# Patient Record
Sex: Male | Born: 1975
Health system: Southern US, Community
[De-identification: ages and names within clinical notes are randomized; demographics above are authoritative.]

## PROBLEM LIST (undated history)

## (undated) DIAGNOSIS — G4730 Sleep apnea, unspecified: Secondary | ICD-10-CM

## (undated) DIAGNOSIS — Z9889 Other specified postprocedural states: Secondary | ICD-10-CM

## (undated) DIAGNOSIS — I10 Essential (primary) hypertension: Secondary | ICD-10-CM

## (undated) DIAGNOSIS — S79912A Unspecified injury of left hip, initial encounter: Secondary | ICD-10-CM

## (undated) DIAGNOSIS — E119 Type 2 diabetes mellitus without complications: Secondary | ICD-10-CM

## (undated) DIAGNOSIS — F329 Major depressive disorder, single episode, unspecified: Secondary | ICD-10-CM

## (undated) DIAGNOSIS — G43109 Migraine with aura, not intractable, without status migrainosus: Secondary | ICD-10-CM

## (undated) HISTORY — PX: WISDOM TOOTH EXTRACTION: SHX21

## (undated) HISTORY — PX: CORONARY ARTERY BYPASS GRAFT: SHX141

## (undated) HISTORY — DX: Migraine with aura, not intractable, without status migrainosus: G43.109

## (undated) HISTORY — DX: Sleep apnea, unspecified: G47.30

## (undated) HISTORY — PX: VASECTOMY: SHX75

## (undated) HISTORY — DX: Major depressive disorder, single episode, unspecified: F32.9

## (undated) HISTORY — DX: Unspecified injury of left hip, initial encounter: S79.912A

## (undated) HISTORY — DX: Other specified postprocedural states: Z98.890

## (undated) HISTORY — DX: Essential (primary) hypertension: I10

## (undated) HISTORY — DX: Type 2 diabetes mellitus without complications: E11.9

---

## 2013-07-11 ENCOUNTER — Other Ambulatory Visit (HOSPITAL_BASED_OUTPATIENT_CLINIC_OR_DEPARTMENT_OTHER): Payer: Self-pay | Admitting: Podiatry

## 2013-07-11 DIAGNOSIS — M259 Joint disorder, unspecified: Secondary | ICD-10-CM

## 2013-07-15 ENCOUNTER — Ambulatory Visit (HOSPITAL_BASED_OUTPATIENT_CLINIC_OR_DEPARTMENT_OTHER)
Admission: RE | Admit: 2013-07-15 | Discharge: 2013-07-15 | Disposition: A | Discharge status: Home / Self Care | Payer: No Typology Code available for payment source | Source: Ambulatory Visit | Attending: Podiatry | Admitting: Podiatry

## 2013-07-15 DIAGNOSIS — M259 Joint disorder, unspecified: Secondary | ICD-10-CM | POA: Diagnosis present

## 2013-07-15 MED ORDER — GADOBENATE DIMEGLUMINE 529 MG/ML IV SOLN
20.0000 mL | Freq: Once | INTRAVENOUS | Status: AC | PRN
  Administered 2013-07-15: 20 mL via INTRAVENOUS

## 2013-10-17 DIAGNOSIS — S79912A Unspecified injury of left hip, initial encounter: Secondary | ICD-10-CM

## 2013-10-17 HISTORY — DX: Unspecified injury of left hip, initial encounter: S79.912A

## 2014-03-15 DIAGNOSIS — E1042 Type 1 diabetes mellitus with diabetic polyneuropathy: Secondary | ICD-10-CM | POA: Insufficient documentation

## 2014-08-16 LAB — HM DIABETES EYE EXAM

## 2015-01-29 LAB — BASIC METABOLIC PANEL
BUN: 10 mg/dL (ref 4–21)
Creatinine: 0.8 mg/dL (ref 0.6–1.3)
GLUCOSE: 239 mg/dL
POTASSIUM: 4.7 mmol/L (ref 3.4–5.3)
Sodium: 138 mmol/L (ref 137–147)

## 2015-01-29 LAB — HEPATIC FUNCTION PANEL
ALK PHOS: 79 U/L (ref 25–125)
ALT: 36 U/L (ref 10–40)
AST: 23 U/L (ref 14–40)
Bilirubin, Total: 0.3 mg/dL

## 2015-01-29 LAB — HEMOGLOBIN A1C: Hemoglobin A1C: 8.4

## 2015-01-29 LAB — LIPID PANEL
Cholesterol: 172 mg/dL (ref 0–200)
HDL: 42 mg/dL (ref 35–70)
LDL Cholesterol: 108 mg/dL
Triglycerides: 109 mg/dL (ref 40–160)

## 2015-03-06 DIAGNOSIS — K219 Gastro-esophageal reflux disease without esophagitis: Secondary | ICD-10-CM | POA: Insufficient documentation

## 2015-03-20 DIAGNOSIS — G4733 Obstructive sleep apnea (adult) (pediatric): Secondary | ICD-10-CM | POA: Diagnosis not present

## 2015-04-01 MED FILL — LYRICA 50 MG CAPSULE: 50 | 30 days supply | Qty: 30 | Fill #0

## 2015-04-01 MED FILL — PANTOPRAZOLE SOD DR 40 MG T: 40 | 90 days supply | Qty: 90 | Fill #0

## 2015-04-03 DIAGNOSIS — E1065 Type 1 diabetes mellitus with hyperglycemia: Secondary | ICD-10-CM | POA: Diagnosis not present

## 2015-04-03 DIAGNOSIS — E109 Type 1 diabetes mellitus without complications: Secondary | ICD-10-CM | POA: Diagnosis not present

## 2015-04-08 ENCOUNTER — Encounter: Payer: Self-pay | Admitting: *Deleted

## 2015-04-08 ENCOUNTER — Other Ambulatory Visit: Payer: Self-pay | Admitting: *Deleted

## 2015-04-08 DIAGNOSIS — G4733 Obstructive sleep apnea (adult) (pediatric): Secondary | ICD-10-CM | POA: Diagnosis not present

## 2015-04-08 NOTE — Patient Outreach (Addendum)
[facility]) Care Management   04/08/2015  Kenneth [name] Feb 01, 1976 [id number]  Kenneth [name] is an 40 y.o. male who presents to the [facility] Care [facility] office to enroll in the Link To Wellness program for self management assistance with Type I DM and HTN.  Subjective: Kenneth [name] says he was referred to the Link To Wellness program by his wife who is a Runner, broadcasting/film/video. Kenneth [name] states he was diagnosed with Type I DM about 20 years ago. He says his Hgb A1C was last checked on 01/30/15 and it was 8.4% which is his baseline. He says he checks his blood sugar 4-5 times daily, does not exercise, wears an insulin pump, and follows a CHO controlled diet as he prepares the meals for his wife and 65 year old son. 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 He identifies his hypoglycemic threshold at 64 and says his symptoms are : dark spots in his visual field, shakiness and diaphoresis. He states he has never required assistance to treat his lows.   Objective:   Review of Systems  Constitutional: Negative.     Physical Exam  Constitutional: He is oriented to person, place, and time. He appears well-developed and well-nourished.  Respiratory: Effort normal.  Neurological: He is alert and oriented to person, place, and time.  Skin: Skin is warm and dry.     Psychiatric: He has a normal mood and affect. His behavior is normal. Judgment and thought content normal.   Filed Weights   04/08/15 1030  Weight: 257 lb 12.8 oz (116.937 kg)   Filed Vitals:   04/08/15 1030  BP: 110/80    Current Medications:   Current Outpatient Prescriptions  Medication Sig Dispense Refill  . aspirin 81 MG tablet Take 81 mg by mouth daily.    . cholecalciferol (VITAMIN D) 1000 units tablet Take 2,000 Units by mouth daily.    . insulin lispro (HUMALOG) 100 UNIT/ML injection Inject into the skin continuous. For use in insulin pump    .  lisinopril (PRINIVIL,ZESTRIL) 5 MG tablet Take 5 mg by mouth daily.    . metFORMIN (GLUMETZA) 500 MG (MOD) 24 hr tablet Take 500 mg by mouth 2 (two) times daily with a meal.    . methocarbamol (ROBAXIN) 500 MG tablet Take 500 mg by mouth every 8 (eight) hours as needed for muscle spasms (realted to left hip injury).    . naproxen (NAPROSYN) 250 MG tablet Take 1 mg by mouth as needed (for left hip pain).    . pantoprazole (PROTONIX) 40 MG tablet Take 40 mg by mouth daily.    . pregabalin (LYRICA) 50 MG capsule Take 50 mg by mouth daily.     No current facility-administered medications for this visit.    Functional Status:   In your present state of health, do you have any difficulty performing the following activities: 04/08/2015  Hearing? Y  Vision? N  Difficulty concentrating or making decisions? N  Walking or climbing stairs? N  Dressing or bathing? N  Doing errands, shopping? N    Fall/Depression Screening:    PHQ 2/9 Scores 04/08/2015  PHQ - 2 Score 3  PHQ- 9 Score 13    Assessment:   Spouse of [employer] employee enrolling in the Link To wellness program for self management assistance with Type I DM and HTN.  Plan:  [facility] CM Care Plan Problem One  Most Recent Value   Care Plan Problem One  Type I DM with most recent Hgb A1C= 8.4% on 01/30/15, HTN meeting treatment targets of <140/<90   Role Documenting the Problem One  Care Management Coordinator   Care Plan for Problem One  Active   [facility]N Long Term Goal (31-90 days)  Improved glycemic control as evidenced by improved Hgb A1C without increased incidences of hypoglycemia, ongoing good control of HTN as evidenced by BP readings <140/<90   THN Long Term Goal Start Date  04/08/15   Interventions for Problem One Long Term Goal  Discussed Link to Wellness program goals, requirements and benefits, reviewed member's rights and responsibilities ,provided diabetes information packet with explanation of contents, ensured member agreed  and signed consent to participate and authorization to release and receive health information, consent, participation agreement and consent to enroll in program, assessed member's current knowledge of diabetes,reviewed patient's medications and assessed medication adherence, discussed recommendation by ADA that  a B12 level be checked intermittently since Metformin can cause B12 deficiency,  reinforced importance of taking all medications as prescribed,discussed effects of physical activity on glucose levels and long-term glucose control by improving insulin sensitivity and assisting with weight management and cardiovascular health, encouraged patient to begin a brisk walking exercise regimen, reviewed patient's meter history and/or CBG log, discussed blood glucose monitoring and interpretation, discussed recommended target ranges for pre-meal and post-meal, provided blood sugar log sheets with targets for pre and post meal, discussed role of continuous glucose monitor and Link To Wellness/[place] plan benefit coverage, reviewed definitions of hyperglycemia and hypoglycemia, assessed patient's hypoglycemia threshold and usual symptoms, discussed causes, other symptoms, and treatment options (Rule of 15s to treat hypoglycemia), discussed the role of stress on blood sugar, assessed for depression,  as patient has a history of depression,  and discussed free mental health benefits offered through [facility] [employer]ing Program discussed the role of prolonged elevated glucose levels on body systems, discussed recommendations for day to day and long-term diabetes self-care, reviewed recommended daily foot checks, and yearly cholesterol, urine, and eye testing, and recommendations for medical, dental, and emotional self-care, reviewed lab work results from 01/30/15, reviewed upcoming appointments with Bev Paddock RD, CDE on 05/02/15 to discuss continuous glucose monitor, arranged for Link To  Wellness follow up in May 2017     RNCM to fax today's office visit note to Dr. Shawnee Knapp and Yancey Flemings, NP. [facility] will meet quarterly and as needed with patient per Link To Wellness program guidelines to assist with Type I DM and HTN self-management and assess patient's progress toward mutually set goals. Bary Richard RN,CCM,CDE [facility] Care Management Coordinator Link To Wellness Office Phone [phone] Office Fax [phone]

## 2015-04-16 MED FILL — HumaLOG 100 UNIT/ML SOLN: 100 | 30 days supply | Qty: 30 | Fill #0

## 2015-04-17 MED FILL — LISINOPRIL 5 MG TABLET: 5 | 90 days supply | Qty: 90 | Fill #0

## 2015-04-17 MED FILL — METFORMIN HCL ER 500 MG TAB: 500 | 90 days supply | Qty: 180 | Fill #0

## 2015-04-23 ENCOUNTER — Telehealth: Payer: Self-pay

## 2015-04-26 DIAGNOSIS — G4733 Obstructive sleep apnea (adult) (pediatric): Secondary | ICD-10-CM | POA: Diagnosis not present

## 2015-04-30 MED FILL — LYRICA 50 MG CAPSULE: 50 | 30 days supply | Qty: 30 | Fill #1

## 2015-05-02 ENCOUNTER — Encounter: Payer: 59 | Attending: Family Medicine | Admitting: *Deleted

## 2015-05-02 VITALS — Ht 69.0 in | Wt 259.0 lb

## 2015-05-02 DIAGNOSIS — E1065 Type 1 diabetes mellitus with hyperglycemia: Secondary | ICD-10-CM

## 2015-05-02 DIAGNOSIS — E1042 Type 1 diabetes mellitus with diabetic polyneuropathy: Secondary | ICD-10-CM | POA: Insufficient documentation

## 2015-05-02 DIAGNOSIS — IMO0002 Reserved for concepts with insufficient information to code with codable children: Secondary | ICD-10-CM

## 2015-05-02 DIAGNOSIS — E108 Type 1 diabetes mellitus with unspecified complications: Secondary | ICD-10-CM

## 2015-05-02 NOTE — Patient Instructions (Signed)
Plan:  Aim for 6 Carb Choices per meal (90 grams) +/- 1 either way  Aim for 0-3 Carbs per snack if hungry  Include protein in moderation with your meals and snacks Continue reading food labels for Total Carbohydrate of foods Consider  increasing your activity level as tolerated Consider checking BG at alternate times per day   Continue taking medication as directed by MD Consider attending the DM 1/Pump Support Group as desired Consider moving forward on CGM for your Medtronic 630G insulin pump

## 2015-05-06 DIAGNOSIS — G4733 Obstructive sleep apnea (adult) (pediatric): Secondary | ICD-10-CM | POA: Diagnosis not present

## 2015-05-09 ENCOUNTER — Encounter: Payer: Self-pay | Admitting: *Deleted

## 2015-05-09 MED FILL — HumaLOG 100 UNIT/ML SOLN: 100 | 30 days supply | Qty: 30 | Fill #1

## 2015-05-09 NOTE — Progress Notes (Signed)
Diabetes Self-Management Education  Visit Type: First/Initial  Appt. Start Time: 0900 Appt. End Time: 1030  05/09/2015  [name], identified by name and date of birth, is a 40 y.o. male with a diagnosis of Diabetes: Type 1.   ASSESSMENT  Height  (1.753 m), weight 259 lb (117.482 kg). Body mass index is 38.23 kg/(m^2).      Diabetes Self-Management Education - 05/02/15 0924    Visit Information   Visit Type First/Initial   Initial Visit   Diabetes Type Type 1   Are you currently following a meal plan? No   Are you taking your medications as prescribed? Yes   Date Diagnosed 09/28/1995   Health Coping   How would you rate your overall health? Fair   Psychosocial Assessment   Patient Belief/Attitude about Diabetes Motivated to manage diabetes  afraid, in denial and burned out. All of the above at times   Self-care barriers None   Other persons present Patient   Patient Concerns Glycemic Control;Nutrition/Meal planning   Preferred Learning Style Hands on   Learning Readiness Change in progress   What is the last grade level you completed in school? 12th grade   Complications   Last HgB A1C per patient/outside source 8.4 %   How often do you check your blood sugar? > 4 times/day   Number of hypoglycemic episodes per month 3   Can you tell when your blood sugar is low? Yes   What do you do if your blood sugar is low? what ever food is around,    Have you had a dilated eye exam in the past 12 months? Yes   Have you had a dental exam in the past 12 months? Yes   Are you checking your feet? Yes   How many days per week are you checking your feet? 5   Dietary Intake   Breakfast 11 AM; sweetened cereal, cinamon rolls, left overs from supper Or biscuit with meat   Snack (morning) chips, cookies, anything available   Lunch 6 PM: meat, starch, vegetables occasionally bread   Snack (afternoon) chips   Dinner sweetened cereal OR sandwich   Beverage(s) decaf coffee or  diet soda, crystal light, water   Exercise   Exercise Type ADL's   How many days per week to you exercise? 0   How many minutes per day do you exercise? 0   Total minutes per week of exercise 0   Patient Education   Previous Diabetes Education Yes (please comment)  a few times over the past 19 years   Nutrition management  Role of diet in the treatment of diabetes and the relationship between the three main macronutrients and blood glucose level;Food label reading, portion sizes and measuring food.;Carbohydrate counting   Physical activity and exercise  Role of exercise on diabetes management, blood pressure control and cardiac health.   Medications Reviewed patients medication for diabetes, action, purpose, timing of dose and side effects.   Monitoring Other (comment)  Explained options for CGM and coverage by LTW and U[employer] insurance   Individualized Goals (developed by patient)   Nutrition Follow meal plan discussed   Monitoring  Other (comment)  Consider moving forward with Medtronic CGM   Health Coping Other (comment)  Provided information on DM 1/Pump Support Group   Outcomes   Expected Outcomes Demonstrated interest in learning. Expect positive outcomes   Future DMSE PRN   Program Status Completed      Individualized Plan for  Diabetes Self-Management Training:   Learning Objective:  Patient will have a greater understanding of diabetes self-management. Patient education plan is to attend individual and/or group sessions per assessed needs and concerns.   Plan:   Patient Instructions  Plan:  Aim for 6 Carb Choices per meal (90 grams) +/- 1 either way  Aim for 0-3 Carbs per snack if hungry  Include protein in moderation with your meals and snacks Continue reading food labels for Total Carbohydrate of foods Consider  increasing your activity level as tolerated Consider checking BG at alternate times per day   Continue taking medication as directed by MD Consider attending  the DM 1/Pump Support Group as desired Consider moving forward on CGM for your Medtronic 630G insulin pump      Expected Outcomes:  Demonstrated interest in learning. Expect positive outcomes  Education material provided: Meal plan card and Carbohydrate counting sheet, Medtronic CGM information  If problems or questions, patient to contact team via:  Phone and Email  Future DSME appointment: PRN

## 2015-05-13 ENCOUNTER — Ambulatory Visit: Payer: PRIVATE HEALTH INSURANCE

## 2015-05-27 DIAGNOSIS — G4733 Obstructive sleep apnea (adult) (pediatric): Secondary | ICD-10-CM | POA: Diagnosis not present

## 2015-05-31 MED FILL — LYRICA 50 MG CAPSULE: 50 | 30 days supply | Qty: 30 | Fill #2

## 2015-06-03 DIAGNOSIS — Z794 Long term (current) use of insulin: Secondary | ICD-10-CM | POA: Diagnosis not present

## 2015-06-03 DIAGNOSIS — K219 Gastro-esophageal reflux disease without esophagitis: Secondary | ICD-10-CM | POA: Diagnosis not present

## 2015-06-03 DIAGNOSIS — Z13220 Encounter for screening for lipoid disorders: Secondary | ICD-10-CM | POA: Diagnosis not present

## 2015-06-03 DIAGNOSIS — Z7984 Long term (current) use of oral hypoglycemic drugs: Secondary | ICD-10-CM | POA: Diagnosis not present

## 2015-06-03 DIAGNOSIS — E119 Type 2 diabetes mellitus without complications: Secondary | ICD-10-CM | POA: Diagnosis not present

## 2015-06-03 MED FILL — PANTOPRAZOLE SOD DR 40 MG T: 40 | 90 days supply | Qty: 90 | Fill #0

## 2015-06-05 DIAGNOSIS — E109 Type 1 diabetes mellitus without complications: Secondary | ICD-10-CM | POA: Diagnosis not present

## 2015-06-05 DIAGNOSIS — E1065 Type 1 diabetes mellitus with hyperglycemia: Secondary | ICD-10-CM | POA: Diagnosis not present

## 2015-06-06 DIAGNOSIS — G4733 Obstructive sleep apnea (adult) (pediatric): Secondary | ICD-10-CM | POA: Diagnosis not present

## 2015-06-06 MED FILL — HumaLOG 100 UNIT/ML SOLN: 100 | 30 days supply | Qty: 30 | Fill #2

## 2015-06-06 MED FILL — ATORVASTATIN 20 MG TABLET: 20 | 90 days supply | Qty: 90 | Fill #0

## 2015-06-10 ENCOUNTER — Encounter: Payer: Self-pay | Admitting: Internal Medicine

## 2015-06-24 ENCOUNTER — Encounter: Payer: Self-pay | Admitting: *Deleted

## 2015-06-26 DIAGNOSIS — G4733 Obstructive sleep apnea (adult) (pediatric): Secondary | ICD-10-CM | POA: Diagnosis not present

## 2015-06-27 ENCOUNTER — Other Ambulatory Visit (INDEPENDENT_AMBULATORY_CARE_PROVIDER_SITE_OTHER): Payer: PRIVATE HEALTH INSURANCE | Admitting: *Deleted

## 2015-06-27 ENCOUNTER — Encounter: Payer: Self-pay | Admitting: Internal Medicine

## 2015-06-27 ENCOUNTER — Ambulatory Visit (INDEPENDENT_AMBULATORY_CARE_PROVIDER_SITE_OTHER): Payer: 59 | Admitting: Internal Medicine

## 2015-06-27 VITALS — BP 138/78 | HR 92 | Temp 98.0°F | Resp 12 | Ht 68.75 in | Wt 257.0 lb

## 2015-06-27 DIAGNOSIS — E1042 Type 1 diabetes mellitus with diabetic polyneuropathy: Secondary | ICD-10-CM | POA: Diagnosis not present

## 2015-06-27 LAB — POCT GLYCOSYLATED HEMOGLOBIN (HGB A1C): HEMOGLOBIN A1C: 7.7

## 2015-06-27 MED ORDER — GLUCAGON (RDNA) 1 MG IJ KIT: 1.0000 mg | PACK | Freq: Once | INTRAMUSCULAR | Status: DC | PRN

## 2015-06-27 MED ORDER — PREGABALIN 50 MG PO CAPS: 50.0000 mg | ORAL_CAPSULE | Freq: Every day | ORAL | Status: DC

## 2015-06-27 MED ORDER — INSULIN LISPRO 100 UNIT/ML ~~LOC~~ SOLN: SUBCUTANEOUS | Status: DC

## 2015-06-27 MED ORDER — METFORMIN HCL ER 500 MG PO TB24: 500.0000 mg | ORAL_TABLET | Freq: Two times a day (BID) | ORAL | Status: DC

## 2015-06-27 MED FILL — GLUCAGON 1 MG EMERGENCY KIT: 1 | 28 days supply | Qty: 1 | Fill #0

## 2015-06-27 NOTE — Progress Notes (Signed)
Patient ID: Kenneth [name], male   DOB: 03-18-1975, 40 y.o.   MRN: [id number]  HPI: Kenneth [name] is a 40 y.o.-year-old male, referred by his PCP, Dr. Radene Ou, for management of DM1, uncontrolled, with complications (PN). He saw Dr. Meredith Pel and Dr. Hartford Poli.  Patient has been diagnosed with diabetes in [birth year] (age 37); he started on insulin at dx.   Last hemoglobin A1c was: Lab Results  Component Value Date   HGBA1C 8.4 01/29/2015   Pt is on an insulin pump: 630G - 11/2014, without CGM, uses Humalog in the pump. [place] now. Will switch to [place].  He is on Metformin ER 500 mg bid. Pump settings: - basal rates: 12 am: 1.1 units/h 10 am: 1.2 6 pm: 1.1 TDD from basal insulin: 27.2 units (31%)! - ICR:  12 am: 7 8 am: 4.5 - target: 110-120 - ISF: 30 - Insulin on Board: 4u - bolus wizard: on TDD from bolus insulin: 60.5 units (69%)! - extended bolusing: seldom - changes infusion site: q3 days - Meter: Bayer Contour  Pt checks his sugars 5.8x a day and they are:  - am: 87-289 - 2h after b'fast: 220-376 - before lunch: 124-314 - 2h after lunch: 56-184 - before dinner: 53-180 - 2h after dinner: 114-219, 333 - bedtime: 58, 120-234 - nighttime: 63-262 + lows. Lowest sugar was 39 (miscalculated carbs the night before); he has hypoglycemia awareness at 70. No previous hypoglycemia admission. He does have a glucagon kit at home - expired. 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 Highest sugar was 440. No previous DKA admissions.    Pt's meals are: - Breakfast: snack cake, yoghurt - Lunch: sandwich, frozen foods - Dinner: pizza, tacos, steak, veggies, hot dogs - Snacks: chips, ice cream, yoghurt, fruit, 3-4x a day Exercises 3x a week.   - no CKD, last BUN/creatinine:  Lab Results  Component Value Date   BUN 10 01/29/2015   CREATININE 0.8 01/29/2015  07/27/2014: ACR 3.2. 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 On Lisinopril. - last set of  lipids: Lab Results  Component Value Date   CHOL 172 01/29/2015   HDL 42 01/29/2015   LDLCALC 108 01/29/2015   TRIG 109 01/29/2015  On Lipitor. - last eye exam was in 08/16/2014. No DR.  + bilateral cataracts. - + numbness and tingling in his feet. On Lyrica. Hand numbness not responding.   Last TSH: No results found for: TSH  Pt has FH of DM1 in GM and GGM, father with DM2.   ROS: Constitutional: + weight gain, + fatigue, + hot flushes, + poor sleep, no nocturia  Eyes: + blurry vision, no xerophthalmia ENT: + sore throat, no nodules palpated in throat, no dysphagia/odynophagia, no hoarseness,  + hypoacusis Cardiovascular: + CP/no SOB/+ palpitations/+ leg swelling Respiratory: no cough/SOB Gastrointestinal: + N/no V/D/+ C, + heartburn Musculoskeletal: + muscle aches/no joint aches Skin: no rashes, + hair loss Neurological: no tremors/numbness/tingling/dizziness, + HA Psychiatric: + depression/no anxiety  Past Medical History  Diagnosis Date  . Injury of hip, left September 2015  . H/O cardiac catheterization     findings were normal  . Depression, major, single episode   . Diabetes mellitus without complication ([place])   . Hypertension   . Sleep apnea    Past Surgical History  Procedure Laterality Date  . Vasectomy    . Wisdom tooth extraction    . Coronary artery bypass graft     Social History  Social History  . Marital Status: Married    Spouse Name: N/A  . Number of Children: 3   Occupational History  . student   Social History Main Topics  . Smoking status: Never Smoker   . Smokeless tobacco: Never Used  . Alcohol Use: No  . Drug Use: No   Current Outpatient Prescriptions on File Prior to Visit  Medication Sig Dispense Refill  . aspirin 81 MG EC tablet Take by mouth.    . cholecalciferol (VITAMIN D) 1000 units tablet Take 2,000 Units by mouth daily.    Marland Kitchen glucagon (GLUCAGON EMERGENCY) 1 MG injection Inject 1 mg into the vein once as needed.    .  insulin lispro (HUMALOG) 100 UNIT/ML injection DOSED PER INSULIN PUMP: OMNIPOD INSULIN PUMP: Weekdays 12-5a 0.6, 5-9a 0.4, 9a-12a 1.35. Weekends 12a-12a 1.35. ICR: 12-9a 7, 9a-12a 5, ISF 25. Target 110-120.    Marland Kitchen lisinopril (PRINIVIL,ZESTRIL) 5 MG tablet Take by mouth.    . metFORMIN (GLUCOPHAGE-XR) 500 MG 24 hr tablet Take by mouth.    . naproxen (NAPROSYN) 250 MG tablet Take 1 mg by mouth as needed (for left hip pain).    . pantoprazole (PROTONIX) 40 MG tablet Take 40 mg by mouth daily.    . pregabalin (LYRICA) 50 MG capsule Take 50 mg by mouth daily.     No current facility-administered medications on file prior to visit.   Allergies  Allergen Reactions  . Dilantin [Phenytoin] Other (See Comments)    Hallucinations   Family History  Problem Relation Age of Onset  . Arthritis Mother   . Leukemia Mother   . Diabetes Father   . Heart disease Father   . Cancer Father     PE: BP 138/78 mmHg  Pulse 92  Temp(Src) 98 F (36.7 C) (Oral)  Resp 12  Ht 5' 8.75" (1.746 m)  Wt 257 lb (116.574 kg)  BMI 38.24 kg/m2  SpO2 95% Wt Readings from Last 3 Encounters:  06/27/15 257 lb (116.574 kg)  05/02/15 259 lb (117.482 kg)  04/08/15 257 lb 12.8 oz (116.937 kg)   Constitutional: overweight, in NAD Eyes: PERRLA, EOMI, no exophthalmos ENT: moist mucous membranes, no thyromegaly, no cervical lymphadenopathy Cardiovascular: RRR, No MRG Respiratory: CTA B Gastrointestinal: abdomen soft, NT, ND, BS+ Musculoskeletal: no deformities, strength intact in all 4 Skin: moist, warm, no rashes; + many tattoos Neurological: no tremor with outstretched hands, DTR normal in all 4  ASSESSMENT: 1. DM1, uncontrolled, with complications - PN  83/66/2947: C-peptide<0.1 (1.1-4.4 ng/mL), Glucose 176   PLAN:  1. Patient with long-standing, uncontrolled DM1, on insulin therapy.  His sugars are fluctuating, but with a pattern of increased CBGs after b'fast and decreased CBGs before dinner and sometimes  after dinner.  - He is using the Bolus wizard at all times and only does boluses with carbs. I advised him that it is OK to do up to 10% correction boluses. - he sometimes uses dual wave boluses >> advised him to increase the duration of these and to try to use them more frequently since he eats a lot of fatty foods. He has seen nutrition in the past. He also has irrgular meal and sleep times, which impede good diabetes control... - Since his sugars after dinner may decrease to much, I will increase the insulin to carb ratio with this meal. Also, since he can eat through the night, I will decrease his insulin to carb ratio from 12 AM to 6 AM. Since his  dropping his sugars after lunch, I will go up slightly on his insulin to carb ratio with this meal. - Since his sugars are higher after breakfast, I will increase the basal rate from 8 to 10 AM - Since he can drop his sugars too much after correction, I will increase his ISF slightly - We also discussed about the new Medtronic pump, and he is very interested in this. I gave him a brochure and advised him to contact the Medtronic rep about it.  - will also move Metformin at dinnertime (now needs to eat a snack at bedtime to take it). - We discussed about changes to his insulin regimen, as follows:  Patient Instructions  Please change the pump settings as follows: - basal rates: 12 am: 1.1 units/h 8 am: 1.2  6 pm: 1.1 - ICR:  12 am: 7 >> 6 6 am: 4.5  11:30 am: 5 - target: 110-120 - ISF: 30 >> 35 - Insulin on Board: 4h - bolus wizard: on  Please contact [facility] about the new pump: [phone].   - continue checking sugars at different times of the day - check at least 4 times a day, rotating checks - given sugar log and advised how to fill it and to bring it at next appt  - given foot care handout and explained the principles  - given instructions for hypoglycemia management "15-15 rule"  - advised for yearly eye exams - sent glucagon kit Rx to  pharmacy - advised to get ketone strips - advised to always have Glu tablets with him - advised for a Med-alert bracelet mentioning "type 1 diabetes mellitus". He actually has a forearm tattoo: "type 1 diabetic". - will refer to DM education for further help with the pump: carb counting check, basal rate validation, extended bolusing, sick days rules, etc. - given instruction Re: exercising and driving in DM1 (pt instructions) - no signs of other autoimmune disorders - HbA1c today: 7.7% (better!) - Return to clinic in 1 mo with sugar log   - time spent with the patient: 1 hour, of which >50% was spent in obtaining information about his disease, reviewing previous labs, office visit notes, and insulin pump downloads, counseling pt about his condition (please see the discussed topics above), and developing a plan to prevent further hypoglycemia and hyperglycemia. We also discussed about proper diet. Pt had a number of questions which I addressed.

## 2015-06-27 NOTE — Patient Instructions (Signed)
Please change the pump settings as follows: - basal rates: 12 am: 1.1 units/h 8 am: 1.2  6 pm: 1.1 - ICR:  12 am: 7 >> 6 6 am: 4.5  11:30 am: 5 - target: 110-120 - ISF: 30 >> 35 - Insulin on Board: 4u - bolus wizard: on  Please contact Roxann about the new pump: [id number].   Basic Rules for Patients with Type I Diabetes Mellitus  1. The American Diabetes Association (ADA) recommended targets: - fasting sugar <130 - after meal sugar <180 - HbA1C <7%  2. Engage in ?150 min moderate exercise per week  3. Make sure you have ?8h of sleep every night as this helps both blood sugars and your weight.  4. Always keep a sugar log (not only record in your meter) and bring it to all appointments with [place].  5. If you are on a pump, know how to access the settings and to modify the parameters.  6.  Remember, you can always call the number on the back of the pump for emergencies related to the pump.  7. "15-15 rule" for hypoglycemia: if sugars are low, take 15 g of carbs** ("fast sugar" - e.g. 4 glucose tablets, 4 oz orange juice), wait 15 min, then check sugars again. If still <80, repeat. Continue  until your sugars >80, then eat a normal meal.   8. Teach family members and coworkers to inject glucagon. Have a glucagon set at home and one at work. They should call 911 after using the set.  9. If you are on a pump, set "insulin on board" time for 5 hours (if your sugars tend to be higher, can use 4 hours).   10. If you are on a pump, use the "dual wave bolus" setting for high fat foods (e.g. pizza). Start with a setting of 50%-50% (50% instant bolus and 50% prolonged bolus over 3h, for e.g.).    11. If you are on a pump, make sure the basal daily insulin dose is approximately equal (not larger) to the daily insulin you get from boluses, otherwise you are at risk for hypoglycemia.  12. Check sugar before driving. If <100, correct, and only start driving if sugars rise ?161. Check sugar every  hour when on a long drive.  13. Check sugar before exercising. If <100, correct, and only start exercising if sugars rise ?100. Check sugar every hour when on a long exercise routine and 1h after you finished exercising.   If >250, check urine for ketones. If you have moderate-large ketones in urine, do not start exercise. Hydrate yourself with clear liquids and correct the high sugar. Recheck sugars and ketones before attempting to exercise.  Be aware that you might need less insulin when exercising.  *intense, short, exercise bursts can increase your sugars, but  *less intense, longer (>1h), exercise routines can decrease your sugars.  If you are on a pump, you might need to decrease your basal rate by 10% or more (or even disconnect your pump) while you exercise to prevent low sugars. Do not disconnect your pump by more than 3 hours at a time! You also might need to decrease your insulin bolus for the meal prior to your exercise time by 20% or more.  14. Make sure you have a MedAlert bracelet or pendant mentioning "Type I Diabetes Mellitus". If you have a prior episode of severe hypoglycemia or hypoglycemia unawareness, it should also mention this.  15. Please do not walk barefoot. Inspect  your feet for sores/cuts and let us know if you have them.  16. Please call [facility] with any questions and concerns [phone]).   **E.g. of "fast carbs": ? first choice (15 g):  1 tube glucose gel, GlucoPouch 15, 2 oz glucose liquid ? second choice (15-16 g):  3 or 4 glucose tablets (best taken  with water), 15 Dextrose Bits chewable ? third choice (15-20 g):   cup fruit juice,  cup regular soda, 1 cup skim milk,  1 cup sports drink ? fourth choice (15-20 g):  1 small tube [place] gel (not frosting), 2 tbsp raisins, 1 tbsp table sugar,  candy, jelly beans, gum drops - check package for carb amount   (adapted from: Juluis RainierMcCall A.L. "Insulin therapy and hypoglycemia" Endocrinol  Metab Clin N Am 2012, 41: 57-87)

## 2015-06-28 MED FILL — LYRICA 50 MG CAPSULE: 50 | 90 days supply | Qty: 90 | Fill #0

## 2015-07-03 DIAGNOSIS — E108 Type 1 diabetes mellitus with unspecified complications: Secondary | ICD-10-CM | POA: Diagnosis not present

## 2015-07-05 DIAGNOSIS — E108 Type 1 diabetes mellitus with unspecified complications: Secondary | ICD-10-CM | POA: Diagnosis not present

## 2015-07-06 DIAGNOSIS — G4733 Obstructive sleep apnea (adult) (pediatric): Secondary | ICD-10-CM | POA: Diagnosis not present

## 2015-07-09 ENCOUNTER — Telehealth: Payer: Self-pay | Admitting: Internal Medicine

## 2015-07-09 MED ORDER — INSULIN LISPRO 100 UNIT/ML ~~LOC~~ SOLN: SUBCUTANEOUS | Status: DC

## 2015-07-09 MED ORDER — METFORMIN HCL ER 500 MG PO TB24: 500.0000 mg | ORAL_TABLET | Freq: Two times a day (BID) | ORAL | Status: DC

## 2015-07-09 MED FILL — HumaLOG 100 UNIT/ML SOLN: 100 | 90 days supply | Qty: 90 | Fill #0

## 2015-07-09 MED FILL — METFORMIN HCL ER 500 MG TAB: 500 | 90 days supply | Qty: 180 | Fill #0

## 2015-07-09 NOTE — Telephone Encounter (Signed)
Refill sent to pt's pharmacy. 

## 2015-07-09 NOTE — Telephone Encounter (Signed)
Patient need a refill of Humalog and Metformin send to  [facility] - [place], [place] - [address] [phone] (Phone) [phone] (Fax)

## 2015-07-11 ENCOUNTER — Encounter: Payer: Self-pay | Admitting: *Deleted

## 2015-07-11 ENCOUNTER — Other Ambulatory Visit: Payer: Self-pay | Admitting: *Deleted

## 2015-07-11 NOTE — Patient Outreach (Signed)
[place] [facility]) Care Management   07/11/2015  Kenneth [name] 05-28-1975 [id number]  AUL MANGIERI is an 40 y.o. male who presents to the [place] Management office for routine Link To Wellness follow up for self management assistance with Type I DM, HTN and hyperlipidemia.  Subjective:  Kenneth [name] has no complaints. He states that he established care with Dr. Radene Ou and Dr. Cruzita Lederer. He says his blood sugars had improved after the initial pump setting changes but now most of his sugars are averaging around 250. He thinks it might be related to poor absorption at the insulin pump injection site. He  says he will meet with Leonia Reader to initiate the 670 G hybrid closed loop system on 07/16/15.  Objective:   Review of Systems  Constitutional: Negative.     Physical Exam  Constitutional: He is oriented to person, place, and time. He appears well-developed and well-nourished.  Respiratory: Effort normal.  Neurological: He is alert and oriented to person, place, and time.  Skin: Skin is warm and dry.  Psychiatric: He has a normal mood and affect. His behavior is normal. Judgment and thought content normal.    Encounter Medications:   Outpatient Encounter Prescriptions as of 07/11/2015  Medication Sig Note  . aspirin 81 MG EC tablet Take by mouth. 04/08/2015: Received from: [place]  . atorvastatin (LIPITOR) 20 MG tablet  06/27/2015: Received from: External Pharmacy  . cetirizine (ZYRTEC) 10 MG tablet Take 10 mg by mouth daily.   . cholecalciferol (VITAMIN D) 1000 units tablet Take 2,000 Units by mouth daily.   . insulin lispro (HUMALOG) 100 UNIT/ML injection Use in insulin pump - up to 100 units a day   . lisinopril (PRINIVIL,ZESTRIL) 5 MG tablet Take by mouth. 06/10/2015: Received from: [place]  . metFORMIN (GLUCOPHAGE-XR) 500 MG 24 hr tablet Take 1 tablet (500 mg total) by mouth 2 (two) times daily with a meal.   .  pantoprazole (PROTONIX) 40 MG tablet Take 40 mg by mouth daily.   . pregabalin (LYRICA) 50 MG capsule Take 1 capsule (50 mg total) by mouth daily.   Marland Kitchen glucagon (GLUCAGON EMERGENCY) 1 MG injection Inject 1 mg into the muscle once as needed.   . naproxen (NAPROSYN) 250 MG tablet Take 1 mg by mouth as needed (for left hip pain). Reported on 07/11/2015    No facility-administered encounter medications on file as of 07/11/2015.    Functional Status:   In your present state of health, do you have any difficulty performing the following activities: 07/11/2015 04/08/2015  Hearing? Tempie Donning  Vision? N N  Difficulty concentrating or making decisions? N N  Walking or climbing stairs? N N  Dressing or bathing? N N  Doing errands, shopping? N N    Fall/Depression Screening:    PHQ 2/9 Scores 05/09/2015 04/08/2015  PHQ - 2 Score 0 3  PHQ- 9 Score - 13    Assessment:   Spouse of [employer] employee and member of the Link To Wellness program with improved glycemic control, ongoing good control of HTN with new diagnosis of hyperlipidemia with initiation of statin therapy.   Plan:  [facility] CM Care Plan Problem One        Most Recent Value   Care Plan Problem One  Type I DM with improved glycemic control as evidenced by most recent Hgb A1C= 7.7% on 06/27/15 previous Hgb A1C= 8.4% on 01/30/15, HTN meeting treatment targets of <140/<90, new  diagnosis of hyperlipidemia with elevated total cholesterol of 205 and elevated LDL= 132   Role Documenting the Problem One  Care Management Coordinator   Care Plan for Problem One  Active   THN Long Term Goal (31-90 days)  Ongoing improvement of glycemic control as evidenced by improved Hgb A1C of <7.5% without increased incidences of hypoglycemia, ongoing good control of HTN and improved lipid profile at next assessment   [facility] Long Term Goal Start Date  07/11/15   [facility] Long Term Goal Met Date     Interventions for Problem One Long Term Goal  reviewed notes with [name] from his  initial visit with Dr Cruzita Lederer on 5/11 and his initial visit with Dr. Radene Ou on 06/03/15 including the results of all labs and changes to treatment plan, discussed blood sugars and recent pump setting adjustments, discussed DKA - definition, treatment and prevention, provided handout, discussed sick day rules and provided handout, reviewed upcoming appointments with Leonia Reader on 07/16/15 to initiate 57 G closed hybrid loop system, request to [facility] that e-mail this RNCM when he has the system placed so that a Link To Wellness follow up appointment can be arranged     RN CM to fax today's office visit note to Dr. Radene Ou. RN CM will meet quarterly and as needed with patient per Link To Wellness program guidelines to assist with Type I DM, HTN, hyperlipidemia and obesity self-management and assess patient's progress toward mutually set goals. Barrington Ellison RN,CCM,CDE [place] Management Coordinator Link To Wellness Office Phone [phone] Office Fax [phone]

## 2015-07-16 ENCOUNTER — Encounter: Payer: PRIVATE HEALTH INSURANCE | Admitting: Nutrition

## 2015-07-16 ENCOUNTER — Encounter: Payer: 59 | Attending: Family Medicine | Admitting: Nutrition

## 2015-07-16 DIAGNOSIS — E1042 Type 1 diabetes mellitus with diabetic polyneuropathy: Secondary | ICD-10-CM | POA: Insufficient documentation

## 2015-07-16 NOTE — Patient Instructions (Signed)
Calibrate sensor every 12 hours Call medtronic to sigh up with Carelink Call us with your user name and password

## 2015-07-16 NOTE — Progress Notes (Signed)
Pt. Was instructed on how to insert/wear the sensor. Settings put into pump: Low alert: 70mg , with pre low alert-on, suspend on low: on, snooze: 60 min. High alert: 250, snooze: 2 hours. We reviewed how to calibrate the sensor, and the new symbols on the pump for sensor timing, and calibrattion Timing.  He inserted a sensor into his left upper abdomen, without difficulty and had no questions.  We reviewed how/when to calibrate the sensor.  He signed off as understanding all topics and had no final questions.  He will return for therapy review in one week.

## 2015-07-18 MED FILL — LISINOPRIL 5 MG TABLET: 5 | 90 days supply | Qty: 90 | Fill #0

## 2015-08-06 DIAGNOSIS — G4733 Obstructive sleep apnea (adult) (pediatric): Secondary | ICD-10-CM | POA: Diagnosis not present

## 2015-08-07 DIAGNOSIS — E7800 Pure hypercholesterolemia, unspecified: Secondary | ICD-10-CM | POA: Diagnosis not present

## 2015-08-13 DIAGNOSIS — K219 Gastro-esophageal reflux disease without esophagitis: Secondary | ICD-10-CM | POA: Diagnosis not present

## 2015-08-13 DIAGNOSIS — E1042 Type 1 diabetes mellitus with diabetic polyneuropathy: Secondary | ICD-10-CM | POA: Diagnosis not present

## 2015-08-13 DIAGNOSIS — E7800 Pure hypercholesterolemia, unspecified: Secondary | ICD-10-CM | POA: Diagnosis not present

## 2015-08-13 DIAGNOSIS — Z0000 Encounter for general adult medical examination without abnormal findings: Secondary | ICD-10-CM | POA: Diagnosis not present

## 2015-08-20 ENCOUNTER — Encounter: Payer: Self-pay | Admitting: Internal Medicine

## 2015-08-21 ENCOUNTER — Other Ambulatory Visit: Payer: Self-pay

## 2015-08-21 MED ORDER — GLUCOSE BLOOD VI STRP: ORAL_STRIP | Status: DC

## 2015-08-21 MED FILL — CONTOUR NEXT STRIPS: 30 days supply | Qty: 100 | Fill #0

## 2015-08-21 NOTE — Telephone Encounter (Signed)
Patient called asking for test strips to be sent to different pharmacy. MD said give enough for 3 months with 3 refills. I sent test strips to pharmacy.

## 2015-08-22 DIAGNOSIS — G4733 Obstructive sleep apnea (adult) (pediatric): Secondary | ICD-10-CM | POA: Diagnosis not present

## 2015-09-05 DIAGNOSIS — G4733 Obstructive sleep apnea (adult) (pediatric): Secondary | ICD-10-CM | POA: Diagnosis not present

## 2015-09-10 MED FILL — ATORVASTATIN 20 MG TABLET: 20 | 90 days supply | Qty: 90 | Fill #0

## 2015-09-24 DIAGNOSIS — H6982 Other specified disorders of Eustachian tube, left ear: Secondary | ICD-10-CM | POA: Diagnosis not present

## 2015-09-24 DIAGNOSIS — E108 Type 1 diabetes mellitus with unspecified complications: Secondary | ICD-10-CM | POA: Diagnosis not present

## 2015-09-24 MED FILL — NAPROXEN 500 MG TABLET: 500 | 15 days supply | Qty: 30 | Fill #0

## 2015-09-27 ENCOUNTER — Ambulatory Visit: Payer: PRIVATE HEALTH INSURANCE | Admitting: Internal Medicine

## 2015-10-01 MED FILL — PANTOPRAZOLE SOD DR 40 MG T: 40 | 90 days supply | Qty: 90 | Fill #1

## 2015-10-01 MED FILL — LYRICA 50 MG CAPSULE: 50 | 90 days supply | Qty: 90 | Fill #1

## 2015-10-11 MED FILL — METFORMIN HCL ER 500 MG TAB: 500 | 90 days supply | Qty: 180 | Fill #1

## 2015-10-11 MED FILL — LISINOPRIL 5 MG TABLET: 5 | 90 days supply | Qty: 90 | Fill #1

## 2015-10-14 MED FILL — HumaLOG 100 UNIT/ML SOLN: 100 | 90 days supply | Qty: 90 | Fill #1

## 2015-10-15 ENCOUNTER — Other Ambulatory Visit: Payer: Self-pay | Admitting: *Deleted

## 2015-10-15 NOTE — Patient Outreach (Signed)
E-mail sent to [facility] personal e-mail address per for preference requesting he contact this RNCM to arrange Link To Wellness follow up for September. Bary RichardJanet S. Lilliann Rossetti RN,CCM,CDE [facility] Care Management Coordinator Link To Wellness Office Phone [phone] Office Fax [phone]

## 2015-10-16 MED FILL — CONTOUR NEXT STRIPS: 30 days supply | Qty: 100 | Fill #1

## 2015-10-24 ENCOUNTER — Other Ambulatory Visit: Payer: Self-pay | Admitting: *Deleted

## 2015-10-24 NOTE — Patient Outreach (Signed)
[place] [facility]) Care Management   10/24/2015  Kenneth [name] 1975-04-11 [id number]  Kenneth [name] is an 40 y.o. male who presents to the [place] Management office for routine Link To Wellness follow up for self management assistance with Type I DM, HTN, hyperlipidemia and obesity.  Subjective: Kenneth [name] says his blood sugars remain highly variable with frequent nocturnal lows that he is able to self treat and consistent readings of >200 during the day. He says he stopped wearing his glucose sensor because it was very inaccurate. He is voicing frustration that he has not been able to get the [id number] 782 G closed loop insulin system even though his endocrinologist prescribed it in May and he was  educated on the system on 5/30. He says he thinks he may have been deprioritized because his last pump was secured through Medicaid.  He say he had his hearing tested and will need hearing aids in both ears although his right is worse than the left. He is not sure of the reason for the hearing loss but says he has a hx of meninginitis, and he has a family history of premature hearing loss. 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 He also remains in on -line school pursing his business degree and is taking 12 hours this semester.    Objective:   Review of Systems  Constitutional: Negative.     Physical Exam  Constitutional: He is oriented to person, place, and time. He appears well-developed and well-nourished.  Respiratory: Effort normal.  Neurological: He is alert and oriented to person, place, and time.  Skin: Skin is warm and dry.  Psychiatric: He has a normal mood and affect. His behavior is normal. Judgment and thought content normal.   Filed Weights   10/24/15 1102  Weight: 251 lb 3.2 oz (113.9 kg)   Vitals:   10/24/15 1102   BP: 118/74    Encounter Medications:   Outpatient Encounter Prescriptions as of 10/24/2015  Medication Sig Note  . aspirin 81 MG EC tablet Take by mouth. 04/08/2015: Received from: [place]  . atorvastatin (LIPITOR) 20 MG tablet  06/27/2015: Received from: External Pharmacy  . cetirizine (ZYRTEC) 10 MG tablet Take 10 mg by mouth daily.   . cholecalciferol (VITAMIN D) 1000 units tablet Take 2,000 Units by mouth daily.   Marland Kitchen glucagon (GLUCAGON EMERGENCY) 1 MG injection Inject 1 mg into the muscle once as needed.   Marland Kitchen glucose blood (BAYER CONTOUR NEXT TEST) test strip Use as instructed   . insulin lispro (HUMALOG) 100 UNIT/ML injection Use in insulin pump - up to 100 units a day (Patient taking differently: Use in insulin pump - up to 100 units a day)   . lisinopril (PRINIVIL,ZESTRIL) 5 MG tablet Take by mouth. 06/10/2015: Received from: [place]  . metFORMIN (GLUCOPHAGE-XR) 500 MG 24 hr tablet Take 1 tablet (500 mg total) by mouth 2 (two) times daily with a meal.   . naproxen (NAPROSYN) 250 MG tablet Take 1 mg by mouth as needed (for left hip pain). Reported on 07/11/2015   . pantoprazole (PROTONIX) 40 MG tablet Take 40 mg by mouth daily.   . pregabalin (LYRICA) 50 MG capsule Take 1 capsule (50 mg total) by mouth daily.    No facility-administered encounter medications on file as of 10/24/2015.  Functional Status:   In your present state of health, do you have any difficulty performing the following activities: 07/11/2015 04/08/2015  Hearing? Tempie Donning  Vision? N N  Difficulty concentrating or making decisions? N N  Walking or climbing stairs? N N  Dressing or bathing? N N  Doing errands, shopping? N N  Some recent data might be hidden    Fall/Depression Screening:    PHQ 2/9 Scores 05/09/2015 04/08/2015  PHQ - 2 Score 0 3  PHQ- 9 Score - 13    Assessment:  Link To Wellness member and spouse of [employer] employee with Type I DM, HTN , hyperlipidemia and obesity with worsening  glycemic control and not meeting treatment target as evidenced by Hgb A1C= 7.7%, improved hyperlipidemia with recent statin therapy and ongoing good control of blood pressure as evidenced by readings consistently; <140/<90.  Plan:   [facility] CM Care Plan Problem One   Flowsheet Row Most Recent Value  Care Plan Problem One Type I DM with decrease in  glycemic control as evidenced by most recent Hgb A1C= 7.7% on 06/27/15 previous Hgb A1C= 7.4% on 06/03/15, HTN meeting treatment targets of <140/<90, recent diagnosis of hyperlipidemia with improved control with initiation of statin therapy as evidenced by normal lipid profile on 08/07/15 and obesity with weight loss of 6 lbs since February 2017.  Role Documenting the Problem One Care Management Coordinator  Care Plan for Problem One Active  THN Long Term Goal (31-90 days) Improved glycemic control as evidenced by Hgb A1C of <7.5% without increased incidences of hypoglycemia and evidence of decreased variability, ongoing good control of HTN and hyperlipidemia as evidenced by meeting treatment targets at next assessment, obesity with ongoing weight loss or no weight gain at next assessment.    THN Long Term Goal Start Date 10/24/15  THN Long Term Goal Met Date    Interventions for Problem One Long Term Goal reviewed notes with [name] from his visit with Dr. Radene Ou on 08/07/15, allowed [facility] to share his frustrations with the delay in getting the Medtronic 45 G closed hybrid loop system as he feels this system will significantly help with his glycemic control, advised him this RNCM will contact the Medtronic representative to see if the process can be expedited,  discussed blood sugars and reviewed treatment of hypoglycemia, discussed [place] insurance benefits related to vision and hearing and e-mailed [name] a list of in-network providers, congratulated [name] on the improvement in his lipid panel and his weight loss and discussed strategies to assist with ongoing good  control, discussed changes coming to the Link To Wellness program in 2018, requested that [facility] contact this RNCM when he has started the 670 G system to arrange Link To Wellness follow up       RNCM to fax today's office visit note to Dr. Radene Ou and Dr.  Cruzita Lederer. RNCM will meet quarterly and as needed with patient per Link To Wellness program guidelines to assist with Type I DM, HTN, hyperlipidemia and obesity self-management and assess patient's progress toward mutually set goals.  Barrington Ellison RN,CCM,CDE [place] Management Coordinator Link To Wellness Office Phone [phone] Office Fax [phone]

## 2015-10-25 ENCOUNTER — Encounter: Payer: Self-pay | Admitting: *Deleted

## 2015-10-25 ENCOUNTER — Other Ambulatory Visit: Payer: Self-pay | Admitting: *Deleted

## 2015-10-25 NOTE — Patient Outreach (Signed)
Notified [name] via e-mail that the Medtronic representative was contacted and she  determined the reason in the delay of getting  The 670 G insulin system to him was related to paperwork that needs correction by Dr. Charlean SanfilippoGherghe's office . Roxann Ripple, the Medtronic representative, will follow through and says Susy FrizzleMatt will receive the system no later than Oct 1st. Bary RichardJanet S. Hauser RN,CCM,CDE [facility] Care Management Coordinator Link To Wellness Office Phone [phone] Office Fax [phone]

## 2015-10-29 ENCOUNTER — Ambulatory Visit (INDEPENDENT_AMBULATORY_CARE_PROVIDER_SITE_OTHER): Payer: 59 | Admitting: Internal Medicine

## 2015-10-29 ENCOUNTER — Encounter: Payer: Self-pay | Admitting: Internal Medicine

## 2015-10-29 VITALS — BP 122/84 | HR 66 | Ht 68.5 in | Wt 250.0 lb

## 2015-10-29 DIAGNOSIS — E1042 Type 1 diabetes mellitus with diabetic polyneuropathy: Secondary | ICD-10-CM

## 2015-10-29 LAB — POCT GLYCOSYLATED HEMOGLOBIN (HGB A1C): Hemoglobin A1C: 8

## 2015-10-29 NOTE — Patient Instructions (Addendum)
Please change the pump settings as follows: - basal rates: 12 am: 1.1 units/h >> 1.2 8 am: 1.2 >> 1.35 4 pm: 1.3 9 pm: 1.1 - ICR:  12 am: 6 6 am: 4.5  11:30 am: 5 >> 4 6 pm: 5 - target: 110-120 - ISF:   12 am: 35 >> 40 - Insulin on Board: 4h - bolus wizard: on  Please come back for a follow-up appointment in 3 months

## 2015-10-29 NOTE — Addendum Note (Signed)
Addended by: Darene LamerHOMPSON, Emalee Knies T on: 10/29/2015 01:27 PM   Modules accepted: Orders

## 2015-10-29 NOTE — Progress Notes (Signed)
Patient ID: Kenneth [name], male   DOB: 11/15/1975, 40 y.o.   MRN: [id number]  HPI: Kenneth [name] is a 40 y.o.-year-old male, referred by his PCP, Dr. Radene Ou, for management of DM1, dx 16 (age 68), uncontrolled, with complications (PN). He saw Dr. Meredith Pel and Dr. Hartford Poli. Last visit 4 months ago.  Last hemoglobin A1c was: Lab Results  Component Value Date   HGBA1C 7.7 06/27/2015   HGBA1C 8.4 01/29/2015   Pt is on an insulin pump: 630G - 11/2014, without CGM, uses Humalog in the pump. Kenneth [name] - supplies. At last visit, we were discussing about the 670G pump. He will get this soon.  He is on Metformin ER 500 mg bid. Pump settings: - basal rates: 12 am: 1.1 units/h 8 am: 1.2  6 pm: 1.1 - ICR:  12 am: 7 >> 6 6 am: 4.5  11:30 am: 5 - target: 110-120 - ISF: 30 >> 35 - Insulin on Board: 4h - bolus wizard: on TDD from basal insulin: 27.2 units (31%) >> 35% TDD from bolus insulin: 60.5 units (69%) >> 65% - extended bolusing: seldom - changes infusion site: q3 days - Meter: Bayer Contour  Pt checks his sugars 4.7x a day and they are (will scan reports): - am: 87-289 >> 132-270, 310 - 2h after b'fast: 220-376 >> n/c - before lunch: 124-314 >> 92-207, 240, 260 - 2h after lunch: 56-184 >> n/c - before dinner: 53-180 >> 88-345 - 2h after dinner: 114-219, 333 >> 138-271 - bedtime: 58, 120-234 >> see above - nighttime: 63-262 >> 50, 326 + lows. Lowest sugar was 39 (miscalculated carbs the night before) >> 50; he has hypoglycemia awareness at 70. No previous hypoglycemia admission. He does have a glucagon kit at home - expired. 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 Highest sugar was 440 >> 300s. No previous DKA admissions.    Pt's meals are: - Breakfast: snack cake, yoghurt - Lunch: sandwich, frozen foods - Dinner: pizza, tacos, steak, veggies, hot dogs - Snacks: chips, ice cream, yoghurt, fruit, 3-4x a day Exercises 3x a week.   - no CKD, last  BUN/creatinine:  Lab Results  Component Value Date   BUN 10 01/29/2015   CREATININE 0.8 01/29/2015  07/27/2014: ACR 3.2. 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 On Lisinopril. - last set of lipids: Lab Results  Component Value Date   CHOL 172 01/29/2015   HDL 42 01/29/2015   LDLCALC 108 01/29/2015   TRIG 109 01/29/2015  On Lipitor. - last eye exam was in 08/16/2014. No DR.  + bilateral cataracts. - + numbness and tingling in his feet. On Lyrica. Hand numbness not responding.   Last TSH: No results found for: TSH  ROS: Constitutional: + weight loss, + occas. Fatigue, no hot flushes, no nocturia  Eyes: + occas. blurry vision, no xerophthalmia ENT: + sore throat, no nodules palpated in throat, no dysphagia/odynophagia, no hoarseness Cardiovascular: no CP/SOB/palpitations/leg swelling Respiratory: no cough/SOB Gastrointestinal: + N/no V/D/C/heartburn Musculoskeletal: no muscle aches/no joint aches Skin: no rashes Neurological: no tremors/numbness/tingling/dizziness, + occas. HA  I reviewed pt's medications, allergies, PMH, social hx, family hx, and changes were documented in the history of present illness. Otherwise, unchanged from my initial visit note.  Past Medical History:  Diagnosis Date  . Depression, major, single episode   . Diabetes mellitus without complication ([place])   . H/O cardiac catheterization    findings were normal  . Hypertension   .  Injury of hip, left September 2015  . Sleep apnea    Past Surgical History:  Procedure Laterality Date  . CORONARY ARTERY BYPASS GRAFT    . VASECTOMY    . WISDOM TOOTH EXTRACTION     Social History   Social History  . Marital Status: Married    Spouse Name: N/A  . Number of Children: 3   Occupational History  . student   Social History Main Topics  . Smoking status: Never Smoker   . Smokeless tobacco: Never Used  . Alcohol Use: No  . Drug Use: No   Current Outpatient Prescriptions on File Prior to Visit   Medication Sig Dispense Refill  . aspirin 81 MG EC tablet Take by mouth.    Marland Kitchen atorvastatin (LIPITOR) 20 MG tablet   2  . cetirizine (ZYRTEC) 10 MG tablet Take 10 mg by mouth daily.    . cholecalciferol (VITAMIN D) 1000 units tablet Take 2,000 Units by mouth daily.    Marland Kitchen glucagon (GLUCAGON EMERGENCY) 1 MG injection Inject 1 mg into the muscle once as needed. 1 each prn  . glucose blood (BAYER CONTOUR NEXT TEST) test strip Use as instructed 100 each 3  . insulin lispro (HUMALOG) 100 UNIT/ML injection Use in insulin pump - up to 100 units a day (Patient taking differently: Use in insulin pump - up to 100 units a day) 90 mL 1  . lisinopril (PRINIVIL,ZESTRIL) 5 MG tablet Take by mouth.    . metFORMIN (GLUCOPHAGE-XR) 500 MG 24 hr tablet Take 1 tablet (500 mg total) by mouth 2 (two) times daily with a meal. 180 tablet 1  . naproxen (NAPROSYN) 250 MG tablet Take 1 mg by mouth as needed (for left hip pain). Reported on 07/11/2015    . pantoprazole (PROTONIX) 40 MG tablet Take 40 mg by mouth daily.    . pregabalin (LYRICA) 50 MG capsule Take 1 capsule (50 mg total) by mouth daily. 90 capsule 1   No current facility-administered medications on file prior to visit.    Allergies  Allergen Reactions  . Dilantin [Phenytoin] Other (See Comments)    Hallucinations   Family History  Problem Relation Age of Onset  . Arthritis Mother   . Leukemia Mother   . Diabetes Father   . Heart disease Father   . Cancer Father    PE: BP 122/84 (BP Location: Left Arm, Patient Position: Sitting)   Pulse 66   Ht 5' 8.5" (1.74 m)   Wt 250 lb (113.4 kg)   BMI 37.46 kg/m  Wt Readings from Last 3 Encounters:  10/29/15 250 lb (113.4 kg)  10/24/15 251 lb 3.2 oz (113.9 kg)  07/11/15 257 lb 9.6 oz (116.8 kg)   Constitutional: overweight, in NAD Eyes: PERRLA, EOMI, no exophthalmos ENT: moist mucous membranes, no thyromegaly, no cervical lymphadenopathy Cardiovascular: RRR, No MRG Respiratory: CTA  B Gastrointestinal: abdomen soft, NT, ND, BS+ Musculoskeletal: no deformities, strength intact in all 4 Skin: moist, warm, no rashes; + many tattoos Neurological: no tremor with outstretched hands, DTR normal in all 4  ASSESSMENT: 1. DM1, uncontrolled, with complications - PN  75/64/3329: C-peptide<0.1 (1.1-4.4 ng/mL), Glucose 176   PLAN:  1. Patient with long-standing, uncontrolled DM1, on insulin therapy.  His sugars are fluctuating, but with a pattern of increased CBGs in am after b'fast and and also before dinner. Due to having to add the SSI >> lower sugars at bedtime and sometimes lows overnight. We will therefore:  Increase basal  rates   Decrease ICR with lunch  Increase ISF - He is using the Bolus wizard at all times and only does boluses with carbs. - he sometimes uses dual wave boluses, not recently - We again discussed about the new Medtronic pump, and he will get this next month - will continue Metformin at dinnertime  - We discussed about changes to his insulin regimen, as follows:  Patient Instructions  Please change the pump settings as follows: - basal rates: 12 am: 1.1 units/h >> 1.2 8 am: 1.2 >> 1.35 4 pm: 1.3 9 pm: 1.1 - ICR:  12 am: 6 6 am: 4.5  11:30 am: 5 >> 4 6 pm: 5 - target: 110-120 - ISF:   12 am: 35 >> 40 - Insulin on Board: 4h - bolus wizard: on  Please come back for a follow-up appointment in 3 months  - continue checking sugars at different times of the day - check at least 4 times a day, rotating checks - no signs of other autoimmune disorders - will check annual labs at next visit - HbA1c today: 8.0% (higher!) - Return to clinic in 3 mo with sugar log   - time spent with the patient: 40 min, of which >50% was spent in reviewing his pump downloads, discussing his hypo- and hyper-glycemic episodes, reviewing previous labs and pump settings and developing a plan to avoid hypo- and hyper-glycemia.

## 2015-11-08 MED FILL — CONTOUR NEXT STRIPS: 30 days supply | Qty: 100 | Fill #2

## 2015-11-18 DIAGNOSIS — J0140 Acute pansinusitis, unspecified: Secondary | ICD-10-CM | POA: Diagnosis not present

## 2015-11-18 MED FILL — AMOXICILLIN 500 MG CAPSULE: 500 | 7 days supply | Qty: 28 | Fill #0

## 2015-12-06 MED FILL — CONTOUR NEXT STRIPS: 30 days supply | Qty: 100 | Fill #3

## 2015-12-16 DIAGNOSIS — E108 Type 1 diabetes mellitus with unspecified complications: Secondary | ICD-10-CM | POA: Diagnosis not present

## 2015-12-17 DIAGNOSIS — Z23 Encounter for immunization: Secondary | ICD-10-CM | POA: Diagnosis not present

## 2015-12-18 MED FILL — ATORVASTATIN 20 MG TABLET: 20 | 90 days supply | Qty: 90 | Fill #1

## 2015-12-27 ENCOUNTER — Other Ambulatory Visit: Payer: Self-pay | Admitting: Emergency Medicine

## 2015-12-27 ENCOUNTER — Telehealth: Payer: Self-pay | Admitting: Internal Medicine

## 2015-12-27 MED ORDER — INSULIN LISPRO 100 UNIT/ML ~~LOC~~ SOLN: SUBCUTANEOUS | 1 refills | Status: DC

## 2015-12-27 MED FILL — PANTOPRAZOLE SOD DR 40 MG T: 40 | 90 days supply | Qty: 90 | Fill #0

## 2015-12-27 NOTE — Telephone Encounter (Signed)
[facility].

## 2015-12-27 NOTE — Telephone Encounter (Signed)
Pt came by and said that he needs his Humulog refilled and sent to [facility].

## 2016-01-12 DIAGNOSIS — J029 Acute pharyngitis, unspecified: Secondary | ICD-10-CM | POA: Diagnosis not present

## 2016-01-12 DIAGNOSIS — J069 Acute upper respiratory infection, unspecified: Secondary | ICD-10-CM | POA: Diagnosis not present

## 2016-01-16 ENCOUNTER — Other Ambulatory Visit: Payer: Self-pay | Admitting: Internal Medicine

## 2016-01-16 MED FILL — HumaLOG 100 UNIT/ML SOLN: 100 | 90 days supply | Qty: 90 | Fill #0

## 2016-01-17 MED FILL — CONTOUR NEXT STRIPS: 30 days supply | Qty: 100 | Fill #0

## 2016-01-27 MED FILL — LISINOPRIL 5 MG TABLET: 5 | 90 days supply | Qty: 90 | Fill #0

## 2016-01-28 ENCOUNTER — Ambulatory Visit: Payer: 59 | Admitting: Internal Medicine

## 2016-01-30 DIAGNOSIS — E108 Type 1 diabetes mellitus with unspecified complications: Secondary | ICD-10-CM | POA: Diagnosis not present

## 2016-02-07 ENCOUNTER — Other Ambulatory Visit: Payer: Self-pay | Admitting: Internal Medicine

## 2016-02-07 MED ORDER — METFORMIN HCL ER 500 MG PO TB24: 500.0000 mg | ORAL_TABLET | Freq: Two times a day (BID) | ORAL | 1 refills | Status: DC

## 2016-02-07 MED FILL — METFORMIN HCL ER 500 MG TAB: 500 | 90 days supply | Qty: 180 | Fill #0

## 2016-02-07 NOTE — Telephone Encounter (Signed)
Pt called and requests refills of his Lyrica and Metformin be sent to the [facility].

## 2016-02-11 NOTE — Addendum Note (Signed)
Addended by: Ann MakiBAILEY, Alfreda Hammad T on: 02/11/2016 03:42 PM   Modules accepted: Orders

## 2016-02-13 NOTE — Addendum Note (Signed)
Addended by: Ann MakiBAILEY, Namiah Dunnavant T on: 02/13/2016 09:34 AM   Modules accepted: Orders

## 2016-02-13 NOTE — Telephone Encounter (Signed)
See message. Metformin has been refilled, can we sent the Lyrica as well?

## 2016-02-18 ENCOUNTER — Other Ambulatory Visit: Payer: Self-pay | Admitting: Internal Medicine

## 2016-02-19 MED FILL — CONTOUR NEXT STRIPS: 30 days supply | Qty: 100 | Fill #1

## 2016-02-20 ENCOUNTER — Telehealth: Payer: Self-pay | Admitting: Internal Medicine

## 2016-02-20 ENCOUNTER — Other Ambulatory Visit: Payer: Self-pay

## 2016-02-20 MED ORDER — PREGABALIN 50 MG PO CAPS: 50.0000 mg | ORAL_CAPSULE | Freq: Every day | ORAL | 1 refills | Status: DC

## 2016-02-20 NOTE — Telephone Encounter (Signed)
Faxing over rx today.

## 2016-02-20 NOTE — Telephone Encounter (Signed)
Refill of   LYRICA 50 MG capsule   [facility] [place] - [address] [phone] (Phone) [phone] (Fax)

## 2016-02-21 ENCOUNTER — Other Ambulatory Visit: Payer: Self-pay

## 2016-02-21 MED ORDER — PREGABALIN 50 MG PO CAPS: 50.0000 mg | ORAL_CAPSULE | Freq: Every day | ORAL | 1 refills | Status: DC

## 2016-02-21 MED FILL — LYRICA 50 MG CAPSULE: 50 | 90 days supply | Qty: 90 | Fill #0

## 2016-02-25 ENCOUNTER — Encounter: Payer: 59 | Attending: Internal Medicine | Admitting: Nutrition

## 2016-02-25 DIAGNOSIS — E1042 Type 1 diabetes mellitus with diabetic polyneuropathy: Secondary | ICD-10-CM

## 2016-02-26 NOTE — Patient Instructions (Signed)
Test blood sugars before each meal and ate bedtime Sign up for carelink Calibrate sensor every 8 hoiurs, especially at bedtime. Call if questions

## 2016-02-26 NOTE — Progress Notes (Signed)
Patient had put into the pump all of his insulin settings, date/time, alerts, correctly.  We reviewd the changes that are different with the 670G pump and he reported good understanding of this.   Settins are from 630 pump: MN: 1.2, 8AM: 1.35, 4PM: 1.3, 9PM: 1.1.   I/C: MN: 6, 6AM:.4.5, 11:30AM: 4, 6PM: 5, target 110-120, ISF:: 40  He was never using temp basal rates, nor duel wave boluses.  We discussed when/how to do this and he re demonstrated how to do each of these.   He was trained on how to insert and tape the new sensor and put in settings of alert before low at 75, and high at 250.  We reviewed the need to calibrate the sensor/when and how to do this.  He reported good understanding of this.  He had no final questions.  He agreed to read over the Booklet on CGM, and sign up for carelink, before coming back in 2 weeks to start auto mode He had no final questions.

## 2016-03-06 DIAGNOSIS — E108 Type 1 diabetes mellitus with unspecified complications: Secondary | ICD-10-CM | POA: Diagnosis not present

## 2016-03-10 ENCOUNTER — Encounter: Payer: 59 | Admitting: Nutrition

## 2016-03-10 DIAGNOSIS — E1042 Type 1 diabetes mellitus with diabetic polyneuropathy: Secondary | ICD-10-CM

## 2016-03-13 ENCOUNTER — Ambulatory Visit (INDEPENDENT_AMBULATORY_CARE_PROVIDER_SITE_OTHER): Payer: 59 | Admitting: Internal Medicine

## 2016-03-13 ENCOUNTER — Encounter: Payer: Self-pay | Admitting: Internal Medicine

## 2016-03-13 VITALS — BP 140/88 | HR 79 | Wt 255.0 lb

## 2016-03-13 DIAGNOSIS — E1042 Type 1 diabetes mellitus with diabetic polyneuropathy: Secondary | ICD-10-CM | POA: Diagnosis not present

## 2016-03-13 LAB — LIPID PANEL
CHOLESTEROL: 115 mg/dL (ref 0–200)
HDL: 42.6 mg/dL (ref >39.00)
LDL CALC: 60 mg/dL (ref 0–99)
NonHDL: 72.58
Total CHOL/HDL Ratio: 3
Triglycerides: 65 mg/dL (ref 0.0–149.0)
VLDL: 13 mg/dL (ref 0.0–40.0)

## 2016-03-13 LAB — MICROALBUMIN / CREATININE URINE RATIO
Creatinine,U: 81.4 mg/dL
Microalb Creat Ratio: 0.9 mg/g (ref 0.0–30.0)
Microalb, Ur: <0.7 mg/dL (ref 0.0–1.9)

## 2016-03-13 LAB — TSH: TSH: 3.89 u[IU]/mL (ref 0.35–4.50)

## 2016-03-13 LAB — POCT GLYCOSYLATED HEMOGLOBIN (HGB A1C): Hemoglobin A1C: 8.2

## 2016-03-13 NOTE — Patient Instructions (Addendum)
Please change the pump settings as follows: - basal rates: 12 am: 1.3 units/h  8 am: 1.35 4 pm: 1.3 9 pm: 1.1 - ICR:  12 am: 6 >> 5 6 am: 4.5  11:30 am: 4 6 pm: 5 (please increase to 5.5 if you still develop lows after dinner) - target: 110-120 - ISF:   12 am: 40 - Insulin on Board: 4h - bolus wizard: on Continue Metformin ER 500 mg 2x a day.  Please return in 1.5 months with your sugar log.

## 2016-03-13 NOTE — Progress Notes (Signed)
Patient ID: Kenneth [name], male   DOB: Jul 17, 1975, 41 y.o.   MRN: [id number]  HPI: Kenneth [name] is a 41 y.o.-year-old male, initially referred by his PCP, Dr. Radene Ou, returning for f/u for DM1, dx 81 (age 41), uncontrolled, with complications (PN). He saw Dr. Meredith Pel and Dr. Hartford Poli. Last visit 4 months ago.   Last hemoglobin A1c was: Lab Results  Component Value Date   HGBA1C 8.0 10/29/2015   HGBA1C 7.7 06/27/2015   HGBA1C 8.4 01/29/2015   Pt is on an insulin pump; 670G - since 01/2016, just started the CGM 03/10/2016. Humalog in the pump. [place] - supplies.  He is on Metformin ER 500 mg bid. Pump settings: - basal rates: 12 am: 1.1 units/h >> 1.2 >> 1.3 8 am: 1.2 >> 1.35 4 pm: 1.3 9 pm: 1.1 - ICR:  12 am: 6 6 am: 4.5  11:30 am: 5 >> 4 6 pm: 5 - target: 110-120 - ISF:   12 am: 35 >> 40 - Insulin on Board: 4h - bolus wizard: on TDD from basal insulin: 27.2 units (31%) >> 35% >> 33% TDD from bolus insulin: 60.5 units (69%) >> 65% >> 67% - extended bolusing: seldom - changes infusion site: q3 days - Meter: Bayer Contour  Pt checks his sugars 5.2x a day and they are (will scan reports). There is a great improvement in his sugars after he attached the CGM and started to use the auto mode. It is too early after the attachment of the CGM to evaluate the time in the range and other CGM parameters.  + lows. Lowest sugar was 39 (miscalculated carbs the night before) >> 50 >> 58; he has hypoglycemia awareness at 70. No previous hypoglycemia admission. He does have a glucagon kit at home. 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 Highest sugar was 440 >> 300s >> 301. No previous DKA admissions.    Pt's meals are: - Breakfast: snack cake, yoghurt - Lunch: sandwich, frozen foods - Dinner: pizza, tacos, steak, veggies, hot dogs - Snacks: chips, ice cream, yoghurt, fruit, 3-4x a day Exercises 3x a week.   - no CKD, last BUN/creatinine:  Lab Results   Component Value Date   BUN 10 01/29/2015   CREATININE 0.8 01/29/2015  07/27/2014: ACR 3.2. 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 On Lisinopril. - last set of lipids: Lab Results  Component Value Date   CHOL 172 01/29/2015   HDL 42 01/29/2015   LDLCALC 108 01/29/2015   TRIG 109 01/29/2015  On Lipitor. - last eye exam was in 08/16/2014. No DR.  + bilateral cataracts. - + numbness and tingling in his feet. On Lyrica. Hand numbness not responding.   Last TSH: No results found for: TSH  ROS: Constitutional: no weight gain or loss, no fatigue, no hot flushes, no nocturia  Eyes: + occas. blurry vision, no xerophthalmia ENT: + sore throat, no nodules palpated in throat, no dysphagia/odynophagia, no hoarseness, + decreased hearing Cardiovascular: no CP/palpitations/leg swelling Respiratory: no cough/+ SOB Gastrointestinal: no N/V/D/C/heartburn Musculoskeletal: no muscle aches/no joint aches Skin: no rashes Neurological: no tremors/numbness/tingling/dizziness, + occas. HA  I reviewed pt's medications, allergies, PMH, social hx, family hx, and changes were documented in the history of present illness. Otherwise, unchanged from my initial visit note.  Past Medical History:  Diagnosis Date  . Depression, major, single episode   . Diabetes mellitus without complication ([place])   . H/O cardiac catheterization  findings were normal  . Hypertension   . Injury of hip, left September 2015  . Sleep apnea    Past Surgical History:  Procedure Laterality Date  . CORONARY ARTERY BYPASS GRAFT    . VASECTOMY    . WISDOM TOOTH EXTRACTION     Social History   Social History  . Marital Status: Married    Spouse Name: N/A  . Number of Children: 3   Occupational History  . student   Social History Main Topics  . Smoking status: Never Smoker   . Smokeless tobacco: Never Used  . Alcohol Use: No  . Drug Use: No   Current Outpatient Prescriptions on File Prior to Visit  Medication Sig  Dispense Refill  . aspirin 81 MG EC tablet Take by mouth.    . atorvastatin (LIPITOR) 20 MG tablet   2  . BAYER CONTOUR NEXT TEST test strip USE AS INSTRUCTED 100 each 3  . cetirizine (ZYRTEC) 10 MG tablet Take 10 mg by mouth daily.    . cholecalciferol (VITAMIN D) 1000 units tablet Take 2,000 Units by mouth daily.    . glucagon (GLUCAGON EMERGENCY) 1 MG injection Inject 1 mg into the muscle once as needed. 1 each prn  . insulin lispro (HUMALOG) 100 UNIT/ML injection Use in insulin pump - up to 100 units a day 90 mL 1  . lisinopril (PRINIVIL,ZESTRIL) 5 MG tablet Take by mouth.    . metFORMIN (GLUCOPHAGE-XR) 500 MG 24 hr tablet Take 1 tablet (500 mg total) by mouth 2 (two) times daily with a meal. 180 tablet 1  . naproxen (NAPROSYN) 250 MG tablet Take 1 mg by mouth as needed (for left hip pain). Reported on 07/11/2015    . pantoprazole (PROTONIX) 40 MG tablet Take 40 mg by mouth daily.    . pregabalin (LYRICA) 50 MG capsule Take 1 capsule (50 mg total) by mouth daily. 90 capsule 1   No current facility-administered medications on file prior to visit.    Allergies  Allergen Reactions  . Dilantin [Phenytoin] Other (See Comments)    Hallucinations   Family History  Problem Relation Age of Onset  . Arthritis Mother   . Leukemia Mother   . Diabetes Father   . Heart disease Father   . Cancer Father    PE: BP 140/88 (BP Location: Left Arm, Patient Position: Sitting)   Pulse 79   Wt 255 lb (115.7 kg)   SpO2 96%   BMI 38.21 kg/m  Wt Readings from Last 3 Encounters:  03/13/16 255 lb (115.7 kg)  10/29/15 250 lb (113.4 kg)  10/24/15 251 lb 3.2 oz (113.9 kg)   Constitutional: overweight, in NAD Eyes: PERRLA, EOMI, no exophthalmos ENT: moist mucous membranes, no thyromegaly, no cervical lymphadenopathy Cardiovascular: RRR, No MRG Respiratory: CTA B Gastrointestinal: abdomen soft, NT, ND, BS+ Musculoskeletal: no deformities, strength intact in all 4 Skin: moist, warm, no rashes; + many  tattoos Neurological: no tremor with outstretched hands, DTR normal in all 4  ASSESSMENT: 1. DM1, uncontrolled, with complications - PN  11/08/2014: C-peptide<0.1 (1.1-4.4 ng/mL), Glucose 176   PLAN:  1. Patient with long-standing, uncontrolled DM1, on insulin therapy. He is now on the new Medtronic pump, and added the CGM 3 days ago. The sugars improved immediately after starting the auto mode. This is very encouraging! - He has an inconsistent schedule, sometimes working at night and sometimes during the day. He is still trying to fit in the sensor calibration and also   to optimize his meals. He sometimes gets higher sugars if he eats late at night and we will decrease his insulin to carb ratio from 12 to 6 AM. He occasionally has a low blood sugar if bolusing before 12 AM, and I advised him to keep an eye on this and he may need to increase his ICR with dinner if this continues to happen. - He is using the Bolus wizard at all times  - will continue Metformin at dinnertime  - We discussed about changes to his insulin regimen, as follows:  Patient Instructions  Please change the pump settings as follows: - basal rates: 12 am: 1.3 units/h  8 am: 1.35 4 pm: 1.3 9 pm: 1.1 - ICR:  12 am: 6 >> 5 6 am: 4.5  11:30 am: 4 6 pm: 5 (please increase to 5.5 if you still develop lows after dinner) - target: 110-120 - ISF:   12 am: 40 - Insulin on Board: 4h - bolus wizard: on Continue Metformin ER 500 mg 2x a day.  Please return in 1.5 months with your sugar log.   - continue checking sugars at different times of the day - check at least 4 times a day, rotating checks - no signs of other autoimmune disorders - will check annual labs today - HbA1c today: 8.2% (higher, however, his sugars improved dramatically after adding the CGM) - Return to clinic in 1.5 mo with sugar log   - time spent with the patient: 40 min, of which >50% was spent in reviewing his pump and CGM downloads, discussing  his hypo- and hyper-glycemic episodes, reviewing previous labs and pump settings and developing a plan to avoid hypo- and hyper-glycemia.   Cristina Gherghe, MD PhD [facility]  

## 2016-03-14 LAB — COMPLETE METABOLIC PANEL WITH GFR
ALT: 20 U/L (ref 9–46)
AST: 17 U/L (ref 10–40)
Albumin: 4.1 g/dL (ref 3.6–5.1)
Alkaline Phosphatase: 62 U/L (ref 40–115)
BUN: 9 mg/dL (ref 7–25)
CHLORIDE: 103 mmol/L (ref 98–110)
CO2: 27 mmol/L (ref 20–31)
CREATININE: 0.83 mg/dL (ref 0.60–1.35)
Calcium: 9.2 mg/dL (ref 8.6–10.3)
GFR, Est African American: >89 mL/min (ref >=60)
GFR, Est Non African American: >89 mL/min (ref >=60)
GLUCOSE: 160 mg/dL — AB (ref 65–99)
POTASSIUM: 4.3 mmol/L (ref 3.5–5.3)
SODIUM: 140 mmol/L (ref 135–146)
Total Bilirubin: 0.7 mg/dL (ref 0.2–1.2)
Total Protein: 6.8 g/dL (ref 6.1–8.1)

## 2016-03-14 NOTE — Progress Notes (Signed)
Kenneth [name] was started/trained on the Auto Mode using the slide presentation.  He reported good understanding of the need to calibrate the sensor q 8 hours, count carbs and use the bolus wizard, and do what the pump tells him to do when it alerts him.  He signed of as understanding all topics and had no final questions.  No changes were made to pump settings in manual mode.  To see Dr. Elvera LennoxGherghe on Friday.

## 2016-03-14 NOTE — Patient Instructions (Signed)
Review manual on Auto Mode Call if questions.

## 2016-03-16 MED FILL — ATORVASTATIN 20 MG TABLET: 20 | 90 days supply | Qty: 90 | Fill #2

## 2016-03-16 MED FILL — CONTOUR NEXT STRIPS: 30 days supply | Qty: 100 | Fill #2

## 2016-03-18 ENCOUNTER — Telehealth: Payer: Self-pay | Admitting: Nutrition

## 2016-03-18 NOTE — Telephone Encounter (Signed)
Message left on machine to call me and let me know how he is doing with his pump in the [facility]

## 2016-03-25 MED FILL — PANTOPRAZOLE SOD DR 40 MG T: 40 | 90 days supply | Qty: 90 | Fill #1

## 2016-03-26 DIAGNOSIS — G4733 Obstructive sleep apnea (adult) (pediatric): Secondary | ICD-10-CM | POA: Diagnosis not present

## 2016-03-30 MED FILL — HumaLOG 100 UNIT/ML SOLN: 100 | 90 days supply | Qty: 90 | Fill #1

## 2016-04-01 DIAGNOSIS — H52223 Regular astigmatism, bilateral: Secondary | ICD-10-CM | POA: Diagnosis not present

## 2016-04-01 DIAGNOSIS — H5213 Myopia, bilateral: Secondary | ICD-10-CM | POA: Diagnosis not present

## 2016-04-01 DIAGNOSIS — H524 Presbyopia: Secondary | ICD-10-CM | POA: Diagnosis not present

## 2016-04-07 ENCOUNTER — Telehealth: Payer: Self-pay | Admitting: Nutrition

## 2016-04-07 NOTE — Telephone Encounter (Signed)
04/01/16 9AM:Message left on answering machine to change I/C ratio per Dr. Charlean SanfilippoGherghe's voice order:  MN: 5.5, 6AM: 5.0, 11:30: 4 (no change), and 5PM: to 5.5.  Pt. Was told to call if he needed assistance with making the changes on his pump settings.

## 2016-04-09 MED FILL — CONTOUR NEXT STRIPS: 15 days supply | Qty: 100 | Fill #3

## 2016-04-17 DIAGNOSIS — J029 Acute pharyngitis, unspecified: Secondary | ICD-10-CM | POA: Diagnosis not present

## 2016-04-21 MED FILL — AMOXICILLIN 500 MG CAPSULE: 500 | 7 days supply | Qty: 14 | Fill #0

## 2016-04-22 ENCOUNTER — Telehealth: Payer: Self-pay | Admitting: Internal Medicine

## 2016-04-22 ENCOUNTER — Other Ambulatory Visit: Payer: Self-pay

## 2016-04-22 MED ORDER — GLUCOSE BLOOD VI STRP: ORAL_STRIP | 3 refills | Status: DC

## 2016-04-22 NOTE — Telephone Encounter (Signed)
rx submitted with increased testing number.

## 2016-04-22 NOTE — Telephone Encounter (Signed)
Pt called in and needs a prescription of the Bayer Contour Next test strips sent into [facility].  He stated that before he only has had enough for about 2 weeks and that he is testing 6-8 times daily.

## 2016-04-23 MED FILL — CONTOUR NEXT STRIPS: 90 days supply | Qty: 700 | Fill #0

## 2016-04-23 MED FILL — IBUPROFEN 800 MG TABLET: 800 | 8 days supply | Qty: 25 | Fill #0

## 2016-04-24 DIAGNOSIS — E108 Type 1 diabetes mellitus with unspecified complications: Secondary | ICD-10-CM | POA: Diagnosis not present

## 2016-04-27 DIAGNOSIS — G4733 Obstructive sleep apnea (adult) (pediatric): Secondary | ICD-10-CM | POA: Diagnosis not present

## 2016-04-27 DIAGNOSIS — H903 Sensorineural hearing loss, bilateral: Secondary | ICD-10-CM | POA: Diagnosis not present

## 2016-05-08 ENCOUNTER — Ambulatory Visit (INDEPENDENT_AMBULATORY_CARE_PROVIDER_SITE_OTHER): Payer: 59 | Admitting: Internal Medicine

## 2016-05-08 ENCOUNTER — Encounter: Payer: Self-pay | Admitting: Internal Medicine

## 2016-05-08 VITALS — BP 124/88 | HR 90 | Ht 69.0 in | Wt 261.0 lb

## 2016-05-08 DIAGNOSIS — E1042 Type 1 diabetes mellitus with diabetic polyneuropathy: Secondary | ICD-10-CM

## 2016-05-08 MED ORDER — INSULIN ASPART 100 UNIT/ML ~~LOC~~ SOLN: 100.0000 [IU] | Freq: Three times a day (TID) | SUBCUTANEOUS | 11 refills | Status: DC

## 2016-05-08 MED FILL — LISINOPRIL 5 MG TABLET: 5 | 90 days supply | Qty: 90 | Fill #1

## 2016-05-08 NOTE — Progress Notes (Signed)
Patient ID: Kenneth [name], male   DOB: 1975-03-23, 41 y.o.   MRN: [id number]  HPI: Kenneth [name] is a 41 y.o.-year-old male, initially referred by his PCP, Dr. Radene Ou, returning for f/u for DM1, dx 78 (age 41), uncontrolled, with complications (PN). He previously saw Dr. Meredith Pel and Dr. Hartford Poli. Last visit 2 months ago.   Last hemoglobin A1c was: Lab Results  Component Value Date   HGBA1C 8.2 03/13/2016   HGBA1C 8.0 10/29/2015   HGBA1C 7.7 06/27/2015   Pt is on an insulin pump; 670G - since 01/2016, + CGM since 03/10/2016. Humalog in the pump. [place] - supplies.  He is also on Metformin ER 500 mg bid.  Pump settings: - basal rates: 12 am: 1.3 units/h  8 am: 1.35 4 pm: 1.3 9 pm: 1.1 - ICR:  12 am: 6 >> 5 6 am: 4.5  11:30 am: 4 6 pm: 5 - target: 110-120 - ISF:   12 am: 40 - Insulin on Board: 4h - bolus wizard: on TDD from basal insulin: 27.2 units (31%) >> 35% >> 33% >> 34% TDD from bolus insulin: 60.5 units (69%) >> 65% >> 67% >> 66% - extended bolusing: seldom - changes infusion site: q3 days - Meter: Bayer Contour  Pt checks his sugars 8.2x a day (will scan reports).  CGM alarms are: 65 and 225 respectively.  CGM parameters: - Average from CGM: 153+/-53, estimated HbA1c 7.0% - Average from manual BG checks: 180+/-66  Time in range:  - very low (40-50): 1% - low (50-70): 2% - normal range (70-180): 68% - high sugars (180-250): 24% - very high sugars (250-400): 5%  - in auto mode: 86% - in manual mode: 14%  + lows. Lowest sugar was 39 (miscalculated carbs the night before) >> 50 >> 58 >> 46; he has hypoglycemia awareness at 70. No previous hypoglycemia admission. He does have a glucagon kit at home. 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 Highest sugar was 440 >> 300s >> 301 >> 322. No previous DKA admissions.    Pt's meals are: - Breakfast: snack cake, yoghurt - Lunch: sandwich, frozen foods - Dinner: pizza, tacos,  steak, veggies, hot dogs - Snacks: chips, ice cream, yoghurt, fruit, 3-4x a day Exercises 3x a week.   - no CKD, last BUN/creatinine:  Lab Results  Component Value Date   BUN 9 03/13/2016   CREATININE 0.83 03/13/2016  07/27/2014: ACR 3.2. 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 On Lisinopril. - last set of lipids: Lab Results  Component Value Date   CHOL 115 03/13/2016   HDL 42.60 03/13/2016   LDLCALC 60 03/13/2016   TRIG 65.0 03/13/2016   CHOLHDL 3 03/13/2016  On Lipitor. - last eye exam was in 08/16/2014. No DR.  + bilateral cataracts. - + numbness and tingling in his feet. On Lyrica. Hand numbness not responding.   Last TSH: Lab Results  Component Value Date   TSH 3.89 03/13/2016   ROS: Constitutional: no weight gain or loss, + fatigue, no hot flushes, no nocturia  Eyes: no blurry vision, no xerophthalmia ENT: no sore throat, no nodules palpated in throat, no dysphagia/odynophagia, no hoarseness, + decreased hearing Cardiovascular: no CP/palpitations/leg swelling Respiratory: no cough/SOB Gastrointestinal: + N/no V/+D/no C/heartburn Musculoskeletal: no muscle aches/no joint aches Skin: no rashes Neurological: no tremors/numbness/tingling/dizziness, + occas. HA  I reviewed pt's medications, allergies, PMH, social hx, family hx, and changes were documented in the history of present  illness. Otherwise, unchanged from my initial visit note.  Past Medical History:  Diagnosis Date  . Depression, major, single episode   . Diabetes mellitus without complication ([place])   . H/O cardiac catheterization    findings were normal  . Hypertension   . Injury of hip, left September 2015  . Sleep apnea    Past Surgical History:  Procedure Laterality Date  . CORONARY ARTERY BYPASS GRAFT    . VASECTOMY    . WISDOM TOOTH EXTRACTION     Social History   Social History  . Marital Status: Married    Spouse Name: N/A  . Number of Children: 3   Occupational History  . student    Social History Main Topics  . Smoking status: Never Smoker   . Smokeless tobacco: Never Used  . Alcohol Use: No  . Drug Use: No   Current Outpatient Prescriptions on File Prior to Visit  Medication Sig Dispense Refill  . aspirin 81 MG EC tablet Take by mouth.    Marland Kitchen atorvastatin (LIPITOR) 20 MG tablet   2  . cetirizine (ZYRTEC) 10 MG tablet Take 10 mg by mouth daily.    . cholecalciferol (VITAMIN D) 1000 units tablet Take 2,000 Units by mouth daily.    Marland Kitchen glucagon (GLUCAGON EMERGENCY) 1 MG injection Inject 1 mg into the muscle once as needed. 1 each prn  . glucose blood (BAYER CONTOUR NEXT TEST) test strip USE AS INSTRUCTED to text 7 times daily 700 each 3  . insulin lispro (HUMALOG) 100 UNIT/ML injection Use in insulin pump - up to 100 units a day 90 mL 1  . lisinopril (PRINIVIL,ZESTRIL) 5 MG tablet Take by mouth.    . metFORMIN (GLUCOPHAGE-XR) 500 MG 24 hr tablet Take 1 tablet (500 mg total) by mouth 2 (two) times daily with a meal. 180 tablet 1  . pantoprazole (PROTONIX) 40 MG tablet Take 40 mg by mouth daily.    . pregabalin (LYRICA) 50 MG capsule Take 1 capsule (50 mg total) by mouth daily. 90 capsule 1  . naproxen (NAPROSYN) 250 MG tablet Take 1 mg by mouth as needed (for left hip pain). Reported on 07/11/2015     No current facility-administered medications on file prior to visit.    Allergies  Allergen Reactions  . Dilantin [Phenytoin] Other (See Comments)    Hallucinations   Family History  Problem Relation Age of Onset  . Arthritis Mother   . Leukemia Mother   . Diabetes Father   . Heart disease Father   . Cancer Father    PE: BP 124/88 (BP Location: Left Arm, Patient Position: Sitting)   Pulse 90   Ht _0  (1.753 m)   Wt 261 lb (118.4 kg)   SpO2 98%   BMI 38.54 kg/m  Wt Readings from Last 3 Encounters:  05/08/16 261 lb (118.4 kg)  03/13/16 255 lb (115.7 kg)  10/29/15 250 lb (113.4 kg)   Constitutional: overweight, in NAD Eyes: PERRLA, EOMI, no  exophthalmos ENT: moist mucous membranes, no thyromegaly, no cervical lymphadenopathy Cardiovascular: RRR, No MRG Respiratory: CTA B Gastrointestinal: abdomen soft, NT, ND, BS+ Musculoskeletal: no deformities, strength intact in all 4 Skin: moist, warm, no rashes; + many tattoos Neurological: no tremor with outstretched hands, DTR normal in all 4  ASSESSMENT: 1. DM1, uncontrolled, with complications - PN  24/10/7351: C-peptide<0.1 (1.1-4.4 ng/mL), Glucose 176   PLAN:  1. Patient with long-standing, uncontrolled DM1, on insulin therapy. He is now on the  new Medtronic pump, and Doing better after he attached the CGM right before last visit. He is in the auto mode for most of the time, however, he has several issues with this: - The sensor may give him errors and may not receive a CBG for calibration, and he has to repeat the check many times. - The pump is kicking him out of the auto mode if he received the minimum basal rate for a longer period of time - The pump does not suspend before low, which is very important since he has a history of low blood sugars. He had a large blood sugar drop few nights ago, from almost 300s to 50, without the pump alarming or suspending the insulin delivery - I advised him to contact Medtronic and discussed with them about the above issues - He is otherwise doing a great job changing his infusion site frequently, bolusing most of the time before meals (occasionally forgotten boluses later), using the Bolus wizard at all times  - will continue Metformin at dinnertime  - I suggested to:  Patient Instructions  Please continue: - basal rates: 12 am: 1.3 units/h  8 am: 1.35 4 pm: 1.3 9 pm: 1.1 - ICR:  12 am: 5 6 am: 4.5  11:30 am: 4 6 pm: 5  - target: 110-120 - ISF:   12 am: 40 - Insulin on Board: 4h - bolus wizard: on  Check with Medtr[employer] why: - your pump does not suspend before low - kick you out of the auto mode when you use minimum basal  rates  Please return in 2-3 months.  - continue checking sugars at different times of the day - check at least 4 times a day, rotating checks - no signs of other autoimmune disorders - We reviewed annual labs obtained at last visit - Also reviewed last HbA1c: 8.2% (higher, however, his sugars improved after adding the CGM) - Return to clinic in 2-3 mo with sugar log>> will check HbA1c done.  - time spent with the patient: 40 min, of which >50% was spent in reviewing his pump and CGM downloads, discussing his hypo- and hyper-glycemic episodes, reviewing previous labs and pump settings and developing a plan to avoid hypo- and hyper-glycemia.    Philemon Kingdom, MD PhD [facility]

## 2016-05-08 NOTE — Patient Instructions (Addendum)
Please continue: - basal rates: 12 am: 1.3 units/h  8 am: 1.35 4 pm: 1.3 9 pm: 1.1 - ICR:  12 am: 5 6 am: 4.5  11:30 am: 4 6 pm: 5  - target: 110-120 - ISF:   12 am: 40 - Insulin on Board: 4h - bolus wizard: on  Check with Med[employer] why: - your pump does not suspend before low - kick you out of the auto mode when you use minimum basal rates  Please return in 2-3 months.

## 2016-05-26 DIAGNOSIS — E108 Type 1 diabetes mellitus with unspecified complications: Secondary | ICD-10-CM | POA: Diagnosis not present

## 2016-06-03 DIAGNOSIS — S80872A Other superficial bite, left lower leg, initial encounter: Secondary | ICD-10-CM | POA: Diagnosis not present

## 2016-06-03 DIAGNOSIS — T63301A Toxic effect of unspecified spider venom, accidental (unintentional), initial encounter: Secondary | ICD-10-CM | POA: Diagnosis not present

## 2016-06-03 MED FILL — TRIAMCINOLONE 0.5% CREAM: 0.5 | 5 days supply | Qty: 15 | Fill #0

## 2016-06-03 MED FILL — LYRICA 50 MG CAPSULE: 50 | 90 days supply | Qty: 90 | Fill #1

## 2016-06-10 DIAGNOSIS — G4733 Obstructive sleep apnea (adult) (pediatric): Secondary | ICD-10-CM | POA: Diagnosis not present

## 2016-06-17 ENCOUNTER — Telehealth: Payer: Self-pay | Admitting: Internal Medicine

## 2016-06-17 NOTE — Telephone Encounter (Signed)
Patient would like a refill of insulin Gherghe prescribed him, he didn't know the name of it.

## 2016-06-18 ENCOUNTER — Other Ambulatory Visit: Payer: Self-pay

## 2016-06-18 ENCOUNTER — Telehealth: Payer: Self-pay

## 2016-06-18 MED ORDER — INSULIN ASPART 100 UNIT/ML ~~LOC~~ SOLN: SUBCUTANEOUS | 2 refills | Status: DC

## 2016-06-18 MED FILL — FIASP 100 UNIT/ML SOLN: 100 | 30 days supply | Qty: 30 | Fill #0

## 2016-06-18 NOTE — Telephone Encounter (Signed)
Patient is unsure of what the name is, I gave the two options. Okay to send in the [place] since that is ideal.

## 2016-06-18 NOTE — Telephone Encounter (Signed)
Please clarify with him. FiAsp would be ideal if covered by insurance.

## 2016-06-18 NOTE — Telephone Encounter (Signed)
3 vials per month - inject up to 100 units a day

## 2016-06-18 NOTE — Telephone Encounter (Signed)
Yes, let's try that. If this is approved, he can start the boluses at the start of the meals, not 15 min before.

## 2016-06-18 NOTE — Telephone Encounter (Signed)
Would you like the [place] how it is written in his chart which is in pen form, or to do the vials for his pump. If so, which dosage would you like and I will send it if it goes through. Thank you!

## 2016-06-18 NOTE — Telephone Encounter (Signed)
Called and LVM advised to call back to clarify which medication he needed a refill on. Left call back number.   

## 2016-06-18 NOTE — Telephone Encounter (Signed)
Submitted

## 2016-06-18 NOTE — Telephone Encounter (Signed)
Please advise for me, I just wanted to make sure I was sending in the correct insulin. I see patient is on pump and that humalog is used for the pump, but I also see an insulin Fiasp; or do both need to be submitted. Thank you!

## 2016-06-18 NOTE — Telephone Encounter (Signed)
Called and LVM advised to call back to clarify which medication he needed a refill on. Left call back number.

## 2016-06-18 NOTE — Telephone Encounter (Signed)
Patient stated that Kenneth [name] was gong to try something different with him, but he did not know the name of it.

## 2016-06-23 ENCOUNTER — Other Ambulatory Visit: Payer: Self-pay | Admitting: *Deleted

## 2016-06-23 ENCOUNTER — Encounter: Payer: Self-pay | Admitting: *Deleted

## 2016-06-23 VITALS — BP 122/72 | Ht 69.0 in | Wt 262.8 lb

## 2016-06-23 DIAGNOSIS — E1040 Type 1 diabetes mellitus with diabetic neuropathy, unspecified: Secondary | ICD-10-CM

## 2016-06-23 LAB — POCT GLYCOSYLATED HEMOGLOBIN (HGB A1C): HEMOGLOBIN A1C: 7.2

## 2016-06-23 LAB — POCT CBG (FASTING - GLUCOSE)-MANUAL ENTRY: GLUCOSE FASTING, POC: 173 mg/dL — AB (ref 70–99)

## 2016-06-23 NOTE — Patient Outreach (Signed)
[facility]) Care Management   06/23/2016  Kenneth [name] 1976-02-14 [id number]  Kenneth [name] is an 41 y.o. male who presents to the [facility] office for routine Link To Wellness follow up for self management assistance with Type I DM, HTN, hyperlipidemia and obesity.  Subjective: Kenneth [name] says he has been wearing the 670 G insulin pump since December 2017 and his blood sugars have improved but he is having some issues with the pump. He says he contacted [employer] support as directed by his endocrinologist but it really didn't help. He says he is still having lows as the pump will not suspend before low. He says most of his lows occur during the day and he is able to slef treat. He reports his lowest CBG as 49. He also says Dr Cruzita Lederer wants to start him on Fiasp- a faster acting insulin and he will pick it up today but it will cost him $53 per month. He says he will tyr it for a couple of months and if he doesn't see a big difference he will go back to humalog since he can get it at no cost under the Link To Wellness pharmacy program.He reports he also stopped the Metformin due to stomach upset.  He says he is now wearing Phonak hearing aids in both ears. 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 He also remains in on -line school pursing his business degree and is taking 12 hours this semester.    Objective:   Review of Systems  Constitutional: Negative.     Physical Exam  Constitutional: He is oriented to person, place, and time. He appears well-developed and well-nourished.  Respiratory: Effort normal.  Neurological: He is alert and oriented to person, place, and time.  Skin: Skin is warm and dry.  Psychiatric: He has a normal mood and affect. His behavior is normal. Judgment and thought content normal.    Filed Weights   06/23/16 0952  Weight: 262 lb 12.8 oz (119.2 kg)   Vitals:   06/23/16 0952  BP: 122/72   POC CBG= 173, Guardian sensor = 172 POC Hgb A1C= 7.2%  Encounter Medications:   Outpatient Encounter Prescriptions as of 06/23/2016  Medication Sig Note  . aspirin 81 MG EC tablet Take by mouth. 04/08/2015: Received from: [place]  . atorvastatin (LIPITOR) 20 MG tablet  06/27/2015: Received from: External Pharmacy  . cetirizine (ZYRTEC) 10 MG tablet Take 10 mg by mouth daily.   . cholecalciferol (VITAMIN D) 1000 units tablet Take 2,000 Units by mouth daily.   Marland Kitchen glucose blood (BAYER CONTOUR NEXT TEST) test strip USE AS INSTRUCTED to text 7 times daily   . lisinopril (PRINIVIL,ZESTRIL) 5 MG tablet Take by mouth. 06/10/2015: Received from: [place]  . pantoprazole (PROTONIX) 40 MG tablet Take 40 mg by mouth daily.   . pregabalin (LYRICA) 50 MG capsule Take 1 capsule (50 mg total) by mouth daily.   Marland Kitchen glucagon (GLUCAGON EMERGENCY) 1 MG injection Inject 1 mg into the muscle once as needed.   . insulin aspart (FIASP) 100 UNIT/ML injection Use up to 100 units a day via pump.   . naproxen (NAPROSYN) 250 MG tablet Take 1 mg by mouth as needed (for left hip pain). Reported on 07/11/2015    No facility-administered encounter medications on file as  of 06/23/2016.     Functional Status:   In your present state of health, do you have any difficulty performing the following activities: 06/23/2016 07/11/2015  Hearing? Y Y  Vision? N N  Difficulty concentrating or making decisions? N N  Walking or climbing stairs? N N  Dressing or bathing? N N  Doing errands, shopping? N N  Preparing Food and eating ? N -  Using the Toilet? N -  In the past six months, have you accidently leaked urine? N -  Do you have problems with loss of bowel control? N -  Managing your Medications? N -  Managing your Finances? N -  Housekeeping or managing your Housekeeping? 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  Plan:   THN CM Care Plan Problem One   Flowsheet Row Most Recent Value  Care Plan Problem One Type I DM with improved glycemic control as evidenced by today's POC Hgb A1C= 7.2%, HTN meeting treatment targets of <140/<90, good control of hyperlipidemia as evidenced by normal lipid profile on 08/07/15 and obesity with current body weight= 38.9  Role Documenting the Problem One Care Management Coordinator  Care Plan for Problem One Active  THN Long Term Goal (31-90 days) Ongoing improvement in glycemic control as evidenced by Hgb A1C of <7.0% without increased incidences of hypoglycemia and evidence of decreased variability, ongoing good control of HTN and hyperlipidemia as evidenced by meeting treatment targets at next assessment, obesity with evidence of weight loss or no weight gain at next assessment.    THN Long Term Goal Start Date 06/23/16  THN Long Term Goal Met Date    Interventions for Problem One Long Term Goal Discussed  Matts experience with the 670 G and advised him this RNCM will contact the [employer] rep to help address the issues he is having with the pump not suspending before low, discussed blood sugars and reviewed treatment of hypoglycemia, attempted to access Kenneth [name]'s pump and sensor data via the [employer] Carelink website without success, contacted [employer] rep via email and advised her of the pump concerns, she will contact Kenneth [name] to assist him, reviewed health maintenance test and ensured  he is up to date, dicussed upcoming appointment with Dr Gherge on 6/14 and with his primary care provider on 08/17/16, will arrange for Link To Wellness follow up in 6 months (Nov 2018)        RNCM to fax today's office visit note to Dr. Rankins and Dr.  Gherghe. RNCM will meet twice yearly and as needed with patient per Link To Wellness program guidelines to assist with Type I DM, HTN, hyperlipidemia and obesity self-management and assess patient's progress toward mutually set goals.  Janet S. Hauser RN,CCM,CDE [facility] Care Management Coordinator Link To Wellness Office Phone [phone] Office Fax [phone]  

## 2016-06-25 MED FILL — ATORVASTATIN 20 MG TABLET: 20 | 90 days supply | Qty: 90 | Fill #3

## 2016-07-07 MED FILL — PANTOPRAZOLE SOD DR 40 MG T: 40 | 90 days supply | Qty: 90 | Fill #0

## 2016-07-15 DIAGNOSIS — E108 Type 1 diabetes mellitus with unspecified complications: Secondary | ICD-10-CM | POA: Diagnosis not present

## 2016-07-16 MED FILL — FIASP 100 UNIT/ML SOLN: 100 | 30 days supply | Qty: 30 | Fill #1

## 2016-07-28 ENCOUNTER — Emergency Department (HOSPITAL_BASED_OUTPATIENT_CLINIC_OR_DEPARTMENT_OTHER)
Admission: EM | Admit: 2016-07-28 | Discharge: 2016-07-28 | Disposition: A | Discharge status: Home / Self Care | Payer: 59 | Attending: Emergency Medicine | Admitting: Emergency Medicine

## 2016-07-28 ENCOUNTER — Emergency Department (HOSPITAL_BASED_OUTPATIENT_CLINIC_OR_DEPARTMENT_OTHER): Payer: 59

## 2016-07-28 ENCOUNTER — Encounter (HOSPITAL_BASED_OUTPATIENT_CLINIC_OR_DEPARTMENT_OTHER): Payer: Self-pay

## 2016-07-28 DIAGNOSIS — R519 Headache, unspecified: Secondary | ICD-10-CM

## 2016-07-28 DIAGNOSIS — H5330 Unspecified disorder of binocular vision: Secondary | ICD-10-CM | POA: Diagnosis not present

## 2016-07-28 DIAGNOSIS — Z794 Long term (current) use of insulin: Secondary | ICD-10-CM | POA: Diagnosis not present

## 2016-07-28 DIAGNOSIS — Z7984 Long term (current) use of oral hypoglycemic drugs: Secondary | ICD-10-CM | POA: Diagnosis not present

## 2016-07-28 DIAGNOSIS — Z951 Presence of aortocoronary bypass graft: Secondary | ICD-10-CM | POA: Diagnosis not present

## 2016-07-28 DIAGNOSIS — R51 Headache: Secondary | ICD-10-CM | POA: Diagnosis not present

## 2016-07-28 DIAGNOSIS — Z7982 Long term (current) use of aspirin: Secondary | ICD-10-CM | POA: Diagnosis not present

## 2016-07-28 DIAGNOSIS — E1140 Type 2 diabetes mellitus with diabetic neuropathy, unspecified: Secondary | ICD-10-CM | POA: Diagnosis not present

## 2016-07-28 DIAGNOSIS — I10 Essential (primary) hypertension: Secondary | ICD-10-CM | POA: Diagnosis not present

## 2016-07-28 DIAGNOSIS — H538 Other visual disturbances: Secondary | ICD-10-CM | POA: Diagnosis present

## 2016-07-28 DIAGNOSIS — H539 Unspecified visual disturbance: Secondary | ICD-10-CM | POA: Diagnosis not present

## 2016-07-28 LAB — CBC WITH DIFFERENTIAL/PLATELET
Basophils Absolute: 0.1 10*3/uL (ref 0.0–0.1)
Basophils Relative: 1 %
Eosinophils Absolute: 0 10*3/uL (ref 0.0–0.7)
Eosinophils Relative: 0 %
HCT: 42.2 % (ref 39.0–52.0)
Hemoglobin: 14.6 g/dL (ref 13.0–17.0)
Lymphocytes Relative: 25 %
Lymphs Abs: 2.4 10*3/uL (ref 0.7–4.0)
MCH: 28.2 pg (ref 26.0–34.0)
MCHC: 34.6 g/dL (ref 30.0–36.0)
MCV: 81.5 fL (ref 78.0–100.0)
Monocytes Absolute: 0.7 10*3/uL (ref 0.1–1.0)
Monocytes Relative: 7 %
Neutro Abs: 6.5 10*3/uL (ref 1.7–7.7)
Neutrophils Relative %: 67 %
Platelets: 238 10*3/uL (ref 150–400)
RBC: 5.18 MIL/uL (ref 4.22–5.81)
RDW: 12.9 % (ref 11.5–15.5)
WBC: 9.6 10*3/uL (ref 4.0–10.5)

## 2016-07-28 LAB — BASIC METABOLIC PANEL
Anion gap: 6 (ref 5–15)
BUN: 15 mg/dL (ref 6–20)
CO2: 27 mmol/L (ref 22–32)
Calcium: 8.9 mg/dL (ref 8.9–10.3)
Chloride: 105 mmol/L (ref 101–111)
Creatinine, Ser: 1.12 mg/dL (ref 0.61–1.24)
GFR calc Af Amer: >60 mL/min (ref >60)
GFR calc non Af Amer: >60 mL/min (ref >60)
Glucose, Bld: 206 mg/dL — ABNORMAL HIGH (ref 65–99)
Potassium: 4.2 mmol/L (ref 3.5–5.1)
Sodium: 138 mmol/L (ref 135–145)

## 2016-07-28 MED ORDER — SODIUM CHLORIDE 0.9 % IV BOLUS (SEPSIS)
1000.0000 mL | Freq: Once | INTRAVENOUS | Status: AC
  Administered 2016-07-28: 1000 mL via INTRAVENOUS

## 2016-07-28 MED ORDER — KETOROLAC TROMETHAMINE 30 MG/ML IJ SOLN
30.0000 mg | Freq: Once | INTRAMUSCULAR | Status: AC
  Administered 2016-07-28: 30 mg via INTRAVENOUS
  Filled 2016-07-28: qty 1

## 2016-07-28 MED ORDER — PROCHLORPERAZINE EDISYLATE 5 MG/ML IJ SOLN
5.0000 mg | Freq: Once | INTRAMUSCULAR | Status: AC
  Administered 2016-07-28: 5 mg via INTRAVENOUS
  Filled 2016-07-28: qty 2

## 2016-07-28 MED ORDER — DIPHENHYDRAMINE HCL 50 MG/ML IJ SOLN
25.0000 mg | Freq: Once | INTRAMUSCULAR | Status: AC
  Administered 2016-07-28: 25 mg via INTRAVENOUS
  Filled 2016-07-28: qty 1

## 2016-07-28 NOTE — ED Notes (Signed)
Family at bedside. 

## 2016-07-28 NOTE — ED Triage Notes (Signed)
Pt reports slight headache since last night; denies one now; also reports blurred vision that started about 1030 this morning. Pt denies any pain in triage. Pt has hx of DM and HTN.

## 2016-07-28 NOTE — ED Provider Notes (Signed)
[facility] Provider Note   CSN: [id number] Arrival date & time: 07/28/16  1329     History   Chief Complaint Chief Complaint  Patient presents with  . Blurred Vision    HPI Kenneth [name] is a 41 y.o. male with history of diabetes, hypertension who presents with headache and visual disturbance. Patient reports he had a headache last night that resolved with ibuprofen. When the patient was at work this morning, he had a visual disturbance that he described as the [place] eyeball in both eyes. He states that the visual disturbance remains when he closed one eye and open the other. He denies any headache now. His visual disturbance has resolved without intervention. He does note that he feels like he does not know if he is in or out of his body at this time. He denies any numbness or tingling, dizziness, lightheadedness, complete loss of vision, chest pain, shortness of breath, abdominal pain, nausea, vomiting, urinary symptoms. Patient reports his blood sugar has been within normal range for his normal lately.  HPI  Past Medical History:  Diagnosis Date  . Depression, major, single episode   . Diabetes mellitus without complication (HCC)   . H/O cardiac catheterization    findings were normal  . Hypertension   . Injury of hip, left September 2015  . Sleep apnea     Patient Active Problem List   Diagnosis Date Noted  . DM type 1 with diabetic peripheral neuropathy (HCC) 06/27/2015    Past Surgical History:  Procedure Laterality Date  . CORONARY ARTERY BYPASS GRAFT    . VASECTOMY    . WISDOM TOOTH EXTRACTION         Home Medications    Prior to Admission medications   Medication Sig Start Date End Date Taking? Authorizing Provider  aspirin 81 MG EC tablet Take by mouth.   Yes [provider]  atorvastatin (LIPITOR) 20 MG tablet  06/06/15  Yes [provider]  glucagon (GLUCAGON EMERGENCY) 1 MG injection Inject 1 mg into the muscle  once as needed. 06/27/15  Yes Carlus Pavlov, MD  glucose blood (BAYER CONTOUR NEXT TEST) test strip USE AS INSTRUCTED to text 7 times daily 04/22/16  Yes Carlus Pavlov, MD  insulin aspart (FIASP) 100 UNIT/ML injection Use up to 100 units a day via pump. 06/18/16  Yes Carlus Pavlov, MD  lisinopril (PRINIVIL,ZESTRIL) 5 MG tablet Take by mouth. 04/17/15  Yes [provider]  cetirizine (ZYRTEC) 10 MG tablet Take 10 mg by mouth daily.    [provider]  cholecalciferol (VITAMIN D) 1000 units tablet Take 2,000 Units by mouth daily.    [provider]  insulin lispro (HUMALOG) 100 UNIT/ML injection Use in insulin pump - up to 100 units a day Patient not taking: Reported on 06/23/2016 12/27/15   Carlus Pavlov, MD  metFORMIN (GLUCOPHAGE-XR) 500 MG 24 hr tablet Take 1 tablet (500 mg total) by mouth 2 (two) times daily with a meal. Patient not taking: Reported on 06/23/2016 02/07/16   Carlus Pavlov, MD  naproxen (NAPROSYN) 250 MG tablet Take 1 mg by mouth as needed (for left hip pain). Reported on 07/11/2015    [provider]  pantoprazole (PROTONIX) 40 MG tablet Take 40 mg by mouth daily.    [provider]  pregabalin (LYRICA) 50 MG capsule Take 1 capsule (50 mg total) by mouth daily. 02/21/16   Carlus Pavlov, MD    Family History Family History  Problem  Relation Age of Onset  . Arthritis Mother   . Leukemia Mother   . Diabetes Father   . Heart disease Father   . Cancer Father     Social History Social History  Substance Use Topics  . Smoking status: Never Smoker  . Smokeless tobacco: Never Used  . Alcohol use No     Allergies   Dilantin [phenytoin] and Gabapentin   Review of Systems Review of Systems  Constitutional: Negative for chills and fever.  HENT: Negative for facial swelling and sore throat.   Eyes: Positive for visual disturbance.  Respiratory: Negative for shortness of breath.   Cardiovascular: Negative for chest  pain.  Gastrointestinal: Negative for abdominal pain, nausea and vomiting.  Genitourinary: Negative for dysuria.  Musculoskeletal: Negative for back pain.  Skin: Negative for rash and wound.  Neurological: Positive for headaches. Negative for dizziness and light-headedness.  Psychiatric/Behavioral: The patient is not nervous/anxious.      Physical Exam Updated Vital Signs BP 139/83   Pulse (!) 59   Temp 97.8 F (36.6 C) (Oral)   Resp 19   Ht 5\' 9"  (1.753 m)   Wt 113.4 kg (250 lb)   SpO2 100%   BMI 36.92 kg/m   Physical Exam  Constitutional: He appears well-developed and well-nourished. No distress.  HENT:  Head: Normocephalic and atraumatic.  Mouth/Throat: Oropharynx is clear and moist. No oropharyngeal exudate.  Eyes: Conjunctivae and EOM are normal. Pupils are equal, round, and reactive to light. Right eye exhibits no discharge. Left eye exhibits no discharge. No scleral icterus.  Neck: Normal range of motion. Neck supple. No thyromegaly present.  Cardiovascular: Normal rate, regular rhythm, normal heart sounds and intact distal pulses.  Exam reveals no gallop and no friction rub.   No murmur heard. Pulmonary/Chest: Effort normal and breath sounds normal. No stridor. No respiratory distress. He has no wheezes. He has no rales.  Abdominal: Soft. Bowel sounds are normal. He exhibits no distension. There is no tenderness. There is no rebound and no guarding.  Musculoskeletal: He exhibits no edema.  Lymphadenopathy:    He has no cervical adenopathy.  Neurological: He is alert. Coordination normal.  CN 3-12 intact; normal sensation throughout; 5/5 strength in all 4 extremities; equal bilateral grip strength  Skin: Skin is warm and dry. No rash noted. He is not diaphoretic. No pallor.  Psychiatric: He has a normal mood and affect.  Nursing note and vitals reviewed.    ED Treatments / Results  Labs (all labs ordered are listed, but only abnormal results are displayed) Labs  Reviewed  BASIC METABOLIC PANEL - Abnormal; Notable for the following:       Result Value   Glucose, Bld 206 (*)    All other components within normal limits  CBC WITH DIFFERENTIAL/PLATELET    EKG  EKG Interpretation  Date/Time:  Tuesday July 28 2016 13:56:18 EDT Ventricular Rate:  73 PR Interval:    QRS Duration: 113 QT Interval:  338 QTC Calculation: 373 R Axis:   56 Text Interpretation:  Sinus rhythm Multiple premature complexes, vent & supraven Borderline short PR interval Probable inferior infarct, acute Probable anteroseptal infarct, acute Lateral leads are also involved Baseline wander in lead(s) V1 Partial missing lead(s): V1 No old tracing to compare Confirmed by Jacalyn LefevreHaviland, Julie [phone]([id number]) on 07/28/2016 2:04:55 PM       Radiology Ct Head Wo Contrast  Result Date: 07/28/2016 CLINICAL DATA:  Worsening headache since last evening. Blurriness of vision. EXAM: CT HEAD  WITHOUT CONTRAST TECHNIQUE: Contiguous axial images were obtained from the base of the skull through the vertex without intravenous contrast. COMPARISON:  02/02/2008 head CT report FINDINGS: Brain: No evidence of acute infarction, hemorrhage, hydrocephalus, extra-axial collection or mass lesion/mass effect. Vascular: No hyperdense vessel or unexpected calcification. Skull: Normal. Negative for fracture or focal lesion. Sinuses/Orbits: No acute finding. Other: None IMPRESSION: No acute intracranial abnormality. Electronically Signed   By: Tollie Eth M.D.   On: 07/28/2016 14:11    Procedures Procedures (including critical care time)  Medications Ordered in ED Medications  sodium chloride 0.9 % bolus 1,000 mL (0 mLs Intravenous Stopped 07/28/16 1447)  prochlorperazine (COMPAZINE) injection 5 mg (5 mg Intravenous Given 07/28/16 1423)  diphenhydrAMINE (BENADRYL) injection 25 mg (25 mg Intravenous Given 07/28/16 1423)  ketorolac (TORADOL) 30 MG/ML injection 30 mg (30 mg Intravenous Given 07/28/16 1423)     Initial  Impression / Assessment and Plan / ED Course  I have reviewed the triage vital signs and the nursing notes.  Pertinent labs & imaging results that were available during my care of the patient were reviewed by me and considered in my medical decision making (see chart for details).     Suspect complex or ocular migraine. CBC unremarkable. BMP shows glucose 206. Normal neuro exam without focal deficits. CT head is negative. Patient's symptoms resolved with migraine cocktail in the ED. EKG shows NSR, premature complexes (ventricular and supraventricular), no previous tracing to compare. Patient to follow up with PCP and neurology for further evaluation. Strict return precautions discussed. Patient understands and agrees with plan. Patient vitals stable throughout ED course and discharged in satisfactory condition. I discussed patient case with Dr. Particia Nearing who guided the patient's management and agrees with plan.   Final Clinical Impressions(s) / ED Diagnoses   Final diagnoses:  Binocular visual disturbance  Acute nonintractable headache, unspecified headache type    New Prescriptions New Prescriptions   No medications on file     Verdis Prime 07/28/16 1515    Jacalyn Lefevre, MD 07/28/16 1523

## 2016-07-28 NOTE — Discharge Instructions (Signed)
Please follow-up with your primary care provider in a neurologist for further evaluation and treatment of your symptoms. You can take ibuprofen or Tylenol as prescribed over-the-counter, as needed for your symptoms. Please return to the emergency department if you develop any new or worsening symptoms.

## 2016-07-30 ENCOUNTER — Ambulatory Visit (INDEPENDENT_AMBULATORY_CARE_PROVIDER_SITE_OTHER): Payer: 59 | Admitting: Internal Medicine

## 2016-07-30 ENCOUNTER — Encounter: Payer: Self-pay | Admitting: Internal Medicine

## 2016-07-30 VITALS — BP 132/90 | HR 75 | Wt 259.0 lb

## 2016-07-30 DIAGNOSIS — E1042 Type 1 diabetes mellitus with diabetic polyneuropathy: Secondary | ICD-10-CM

## 2016-07-30 MED ORDER — GLUCAGON (RDNA) 1 MG IJ KIT: 1.0000 mg | PACK | Freq: Once | INTRAMUSCULAR | 99 refills | Status: DC | PRN

## 2016-07-30 MED ORDER — INSULIN LISPRO 100 UNIT/ML ~~LOC~~ SOLN: SUBCUTANEOUS | 3 refills | Status: DC

## 2016-07-30 MED FILL — GLUCAGON 1 MG EMERGENCY KIT: 1 | 30 days supply | Qty: 1 | Fill #0

## 2016-07-30 MED FILL — HumaLOG 100 UNIT/ML SOLN: 100 | 90 days supply | Qty: 90 | Fill #0

## 2016-07-30 NOTE — Patient Instructions (Addendum)
Please change the pump settings as follows: - basal rates: 12 am: 1.3 units/h >> 1.4 8 am: 1.35 >> 1.4 4 pm: 1.3 >> 1.4 9 pm: 1.1 >> 1.2 - ICR:  12 am: 5 6 am: 4.5  11:30 am: 4 6 pm: 5  - target: 110-120  - ISF:   12 am: 40 - Insulin on Board: 4h - bolus wizard: on  Please return in 3 months.

## 2016-07-30 NOTE — Progress Notes (Signed)
Patient ID: Kenneth [name], male   DOB: 06-Sep-1975, 41 y.o.   MRN: [id number]  HPI: Kenneth [name] is a 41 y.o.-year-old male, initially referred by his PCP, Dr. Radene [name], returning for f/u for DM1, dx 41 (age 35), uncontrolled, with complications (PN). He previously saw Dr. Meredith [name] and Dr. Hartford [name]. Last visit with me 3 mo ago.  He was in the emergency room recently for migraine with aura. He will see a neurologist soon.  Last hemoglobin A1c was: Lab Results  Component Value Date   HGBA1C 7.2 06/23/2016   HGBA1C 8.2 03/13/2016   HGBA1C 8.0 10/29/2015   Pt is on an insulin pump; 670G - since 01/2016, + CGM since 03/10/2016. FiAsp in the pump for last ~ 2 mo. Kenneth [name] - supplies.  He was also on Metformin ER 500 mg bid >> stopped as he had nausea.  Pump settings: - basal rates: 12 am: 1.3 units/h  8 am: 1.35 4 pm: 1.3 9 pm: 1.1 - ICR:  12 am: 5.5 6 am: 5 11:30 am: 4 6 pm: 5.5 - target: 110-120 - ISF:   12 am: 40 - Insulin on Board: 4h - bolus wizard: on TDD from basal insulin: 27.2 units (31%) >> 35% >> 33% >> 34% >> 54% TDD from bolus insulin: 60.5 units (69%) >> 65% >> 67% >> 66% >> 46%  total daily dose 104 units  - extended bolusing: seldom - changes infusion site: q3 days - Meter: Bayer Contour  Pt checks his sugars 8.2x a day (will scan reports).  CGM alarms are: 65 and 225 respectively.  CGM parameters: - Average from CGM: 153+/-53 >> 156+/-47 - Average from manual BG checks: 180+/-66 >> 178+/-64  Time in range:  - very low (40-50): 1%>> 0%  - low (50-70): 2% >> 1% - normal range (70-180): 68% >> 70% - high sugars (180-250): 24% >> 26%  - very high sugars (250-400): 5% >> 3%    - in auto mode: 86% >> 71% - in manual mode: 14% >> 29%  His sugars are definitely higher when he spends time in the manual mode.   + lows. Lowest sugar was 39 (miscalculated carbs the night before) >> 50 >> 58 >> 46 >> 81; he has hypoglycemia awareness at 70. No previous  hypoglycemia admission. He Needs a refill of his glucagon kit. 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 Highest sugar was 440 >> 300s >> 301 >> 322 >> 300. No previous DKA admission.  Pt's meals are: - Breakfast: snack cake, yoghurt - Lunch: sandwich, frozen foods - Dinner: pizza, tacos, steak, veggies, hot dogs - Snacks: chips, ice cream, yoghurt, fruit, 3-4x a day Exercises 3x a week.   - No CKD, last BUN/creatinine:  Lab Results  Component Value Date   BUN 15 07/28/2016   CREATININE 1.12 07/28/2016  07/27/2014: ACR 3.2. 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 On Lisinopril. - last set of lipids: Lab Results  Component Value Date   CHOL 115 03/13/2016   HDL 42.60 03/13/2016   LDLCALC 60 03/13/2016   TRIG 65.0 03/13/2016   CHOLHDL 3 03/13/2016  On Lipitor - last eye exam was in 04/2016. No DR.  + bilateral cataracts. - He does have numbness and tingling in his feet. On Lyrica >> helping.  Last TSH: Lab Results  Component Value Date   TSH 3.89 03/13/2016   ROS: Constitutional: no weight gain/no weight loss, no fatigue, no subjective hyperthermia,  no subjective hypothermia Eyes:+ blurry vision, no xerophthalmia ENT: no sore throat, no nodules palpated in throat, no dysphagia, no odynophagia, no hoarseness Cardiovascular: + CP/+ SOB/no palpitations/no leg swelling Respiratory: no cough/+ SOB/no wheezing Gastrointestinal: no N/no V/no D/no C/no acid reflux Musculoskeletal: no muscle aches/no joint aches Skin: no rashes, no hair loss Neurological: no tremors/no numbness/no tingling/no dizziness, + HA  I reviewed pt's medications, allergies, PMH, social hx, family hx, and changes were documented in the history of present illness. Otherwise, unchanged from my initial visit note.   Past Medical History:  Diagnosis Date  . Depression, major, single episode   . Diabetes mellitus without complication ([place])   . H/O cardiac catheterization     findings were normal  . Hypertension   . Injury of hip, left September 2015  . Sleep apnea    Past Surgical History:  Procedure Laterality Date  . CORONARY ARTERY BYPASS GRAFT    . VASECTOMY    . WISDOM TOOTH EXTRACTION     Social History   Social History  . Marital Status: Married    Spouse Name: N/A  . Number of Children: 3   Occupational History  . student   Social History Main Topics  . Smoking status: Never Smoker   . Smokeless tobacco: Never Used  . Alcohol Use: No  . Drug Use: No   Current Outpatient Prescriptions on File Prior to Visit  Medication Sig Dispense Refill  . aspirin 81 MG EC tablet Take by mouth.    Marland Kitchen atorvastatin (LIPITOR) 20 MG tablet   2  . cetirizine (ZYRTEC) 10 MG tablet Take 10 mg by mouth daily.    . cholecalciferol (VITAMIN D) 1000 units tablet Take 2,000 Units by mouth daily.    Marland Kitchen glucose blood (BAYER CONTOUR NEXT TEST) test strip USE AS INSTRUCTED to text 7 times daily 700 each 3  . insulin aspart (FIASP) 100 UNIT/ML injection Use up to 100 units a day via pump. 30 mL 2  . lisinopril (PRINIVIL,ZESTRIL) 5 MG tablet Take by mouth.    . pantoprazole (PROTONIX) 40 MG tablet Take 40 mg by mouth daily.    . pregabalin (LYRICA) 50 MG capsule Take 1 capsule (50 mg total) by mouth daily. 90 capsule 1  . glucagon (GLUCAGON EMERGENCY) 1 MG injection Inject 1 mg into the muscle once as needed. (Patient not taking: Reported on 07/30/2016) 1 each prn  . insulin lispro (HUMALOG) 100 UNIT/ML injection Use in insulin pump - up to 100 units a day (Patient not taking: Reported on 06/23/2016) 90 mL 1  . metFORMIN (GLUCOPHAGE-XR) 500 MG 24 hr tablet Take 1 tablet (500 mg total) by mouth 2 (two) times daily with a meal. (Patient not taking: Reported on 06/23/2016) 180 tablet 1  . naproxen (NAPROSYN) 250 MG tablet Take 1 mg by mouth as needed (for left hip pain). Reported on 07/11/2015     No current facility-administered medications on file prior to visit.    Allergies   Allergen Reactions  . Dilantin [Phenytoin] Other (See Comments)    Hallucinations  . Gabapentin Other (See Comments)    Causes hallucinations   Family History  Problem Relation Age of Onset  . Arthritis Mother   . Leukemia Mother   . Diabetes Father   . Heart disease Father   . Cancer Father    PE: BP 132/90 (BP Location: Left Arm, Patient Position: Sitting)   Pulse 75   Wt 259 lb (117.5 kg)  SpO2 95%   BMI 38.25 kg/m  Wt Readings from Last 3 Encounters:  07/30/16 259 lb (117.5 kg)  07/28/16 250 lb (113.4 kg)  06/23/16 262 lb 12.8 oz (119.2 kg)   Constitutional: overweight, in NAD Eyes: PERRLA, EOMI, no exophthalmos ENT: moist mucous membranes, no thyromegaly, no cervical lymphadenopathy Cardiovascular: RRR, No MRG Respiratory: CTA B Gastrointestinal: abdomen soft, NT, ND, BS+ Musculoskeletal: no deformities, strength intact in all 4 Skin: moist, warm, no rashes Neurological: no tremor with outstretched hands, DTR normal in all 4  ASSESSMENT: 1. DM1, uncontrolled, with complications - PN  57/84/6962: C-peptide<0.1 (1.1-4.4 ng/mL), Glucose 176   PLAN:  1. Patient with long-standing, uncontrolled DM1, on insulin therapy.He is now on the latest [employer] pump, 670 G with integrated CGM. When he is in the automatic mode, he does very well, with the exception when the pump does not suspend before low. However, he is very frequently kicked out of the automatic mode mostly because of problems with calibration. He is doing a very good job checking his sugars often and trying to calibrate the CGM, however, sometimes the CGM refuses a calibration and many times he had to change the sensor after alarms that the sensor is not working properly. - he will have an appointment soon with our diabetes educator and also the diabetes educator from [employer] to GI to fix this problems.  -  he is otherwise doing a great job changing his infusion site frequently, bolusing with his meals and  using the bolus wizard at all times  -  at this visit, we adjusted his pump settings, since his sugars are definitely higher in the manual mode compared to auto mode. We will start by increasing his basal rates. - he is now off metformin but we discussed about possibly restarting it to reduce his insulin requirement - I suggested to:  Patient Instructions  Please change the pump settings as follows: - basal rates: 12 am: 1.3 units/h >> 1.4 8 am: 1.35 >> 1.4 4 pm: 1.3 >> 1.4 9 pm: 1.1 >> 1.2 - ICR:  12 am: 5 6 am: 4.5  11:30 am: 4 6 pm: 5  - target: 110-120  - ISF:   12 am: 40 - Insulin on Board: 4h - bolus wizard: on  Please return in 3 months.  - today, HbA1c is 7%  - continue checking sugars at different times of the day - check 3x a day, rotating checks - advised for yearly eye exams >> he is UTD - Return to clinic in 3 mo with sugar log   - time spent with the patient: 40 min, of which >50% was spent in reviewing his pump and CGM downloads, discussing his hypo- and hyper-glycemic episodes, reviewing previous labs and pump settings and developing a plan to avoid hypo- and hyper-glycemia.   Philemon Kingdom, MD PhD [facility] Endocrinology

## 2016-08-03 MED FILL — LISINOPRIL 5 MG TABLET: 5 | 90 days supply | Qty: 90 | Fill #0

## 2016-08-05 ENCOUNTER — Encounter: Payer: Self-pay | Admitting: Neurology

## 2016-08-05 ENCOUNTER — Ambulatory Visit (INDEPENDENT_AMBULATORY_CARE_PROVIDER_SITE_OTHER): Payer: 59 | Admitting: Neurology

## 2016-08-05 DIAGNOSIS — G43109 Migraine with aura, not intractable, without status migrainosus: Secondary | ICD-10-CM | POA: Diagnosis not present

## 2016-08-05 HISTORY — DX: Migraine with aura, not intractable, without status migrainosus: G43.109

## 2016-08-05 MED ORDER — TOPIRAMATE 25 MG PO TABS: ORAL_TABLET | ORAL | 3 refills | Status: DC

## 2016-08-05 MED FILL — TOPIRAMATE 25 MG TABLET: 25 | 30 days supply | Qty: 60 | Fill #0

## 2016-08-05 NOTE — Progress Notes (Signed)
Reason for visit: Migraine headache  Referring physician: [place]  Kenneth [name] is a 41 y.o. male  History of present illness:  Kenneth [name] is a 41 year old right-handed white male with a history of type 1 diabetes. The patient has a history of migraine headaches that began in high school and have continued throughout his life, he generally will have 2 or 3 headaches a week. The patient usually does not have any visual aura with the headache, but he did have an event that occurred on 07/28/2016. The patient had an eye-shaped visual disturbance that looked like he was looking through stained-glass. The visual disturbance was in both eyes, it was small initially but then enlarged gradually and then went away over 30 minutes. After the visual disturbance cleared he developed a left frontotemporal headache that was not severe. The patient did not have nausea or vomiting. The patient generally notes that his headaches are in the left frontotemporal region, occasionally they may be on the right. The patient usually does not have photophobia, phonophobia, nausea or vomiting with the headache. Occasionally when the headache is very severe, he may have some nausea. The patient denies any numbness or weakness of the face, arms, or legs. He may have some cognitive clouding with the headache. He claims that his father had hemiplegic migraine. The patient has had a seizure events on 2 occasions in 2008 associated with an episode of viral meningitis. He was placed transiently on Dilantin but this resulted in hallucinations, and he is currently not on any anticonvulsant medications with exception of Lyrica that he takes for his diabetic peripheral neuropathy. The patient has been having daily headaches until yesterday following the emergency room visit. He is sent to this office for further evaluation.  Past Medical History:  Diagnosis Date  . Depression, major, single episode   . Diabetes mellitus without  complication ([facility])   . H/O cardiac catheterization    findings were normal  . Hypertension   . Injury of hip, left September 2015  . Sleep apnea     Past Surgical History:  Procedure Laterality Date  . CORONARY ARTERY BYPASS GRAFT  Around 2011 or 2012  . VASECTOMY    . WISDOM TOOTH EXTRACTION     x8    Family History  Problem Relation Age of Onset  . Arthritis Mother   . Leukemia Mother   . Diabetes Father   . Heart disease Father   . Cancer Father     Social history:  reports that he has never smoked. He has never used smokeless tobacco. He reports that he does not drink alcohol or use drugs.  Medications:  Prior to Admission medications   Medication Sig Start Date End Date Taking? Authorizing Provider  aspirin 81 MG EC tablet Take by mouth.   Yes [provider]  atorvastatin (LIPITOR) 20 MG tablet  06/06/15  Yes [provider]  cetirizine (ZYRTEC) 10 MG tablet Take 10 mg by mouth daily.   Yes [provider]  cholecalciferol (VITAMIN D) 1000 units tablet Take 2,000 Units by mouth daily.   Yes [provider]  glucagon (GLUCAGON EMERGENCY) 1 MG injection Inject 1 mg into the muscle once as needed. 07/30/16  Yes Carlus Pavlov, MD  glucose blood (BAYER CONTOUR NEXT TEST) test strip USE AS INSTRUCTED to text 7 times daily 04/22/16  Yes Carlus Pavlov, MD  insulin lispro (HUMALOG) 100 UNIT/ML injection Use in insulin pump - up to 100 units  a day 07/30/16  Yes Carlus Pavlov, MD  lisinopril (PRINIVIL,ZESTRIL) 5 MG tablet Take by mouth. 04/17/15  Yes [provider]  pantoprazole (PROTONIX) 40 MG tablet Take 40 mg by mouth daily.   Yes [provider]  pregabalin (LYRICA) 50 MG capsule Take 1 capsule (50 mg total) by mouth daily. 02/21/16  Yes Carlus Pavlov, MD  metFORMIN (GLUCOPHAGE-XR) 500 MG 24 hr tablet Take 1 tablet (500 mg total) by mouth 2 (two) times daily with a meal. Patient not taking: Reported on 08/05/2016  02/07/16   Carlus Pavlov, MD      Allergies  Allergen Reactions  . Dilantin [Phenytoin] Other (See Comments)    Hallucinations  . Gabapentin Other (See Comments)    Causes hallucinations    ROS:  Out of a complete 14 system review of symptoms, the patient complains only of the following symptoms, and all other reviewed systems are negative.  Fatigue Chest pain Hearing loss, spending sensations Blurred vision Shortness of breath, snoring Allergies Memory loss, confusion, headache, dizziness Depression, anxiety, not enough sleep Insomnia, sleepiness, snoring  Blood pressure 128/79, pulse 79, height 5\' 9"  (1.753 m), weight 256 lb 8 oz (116.3 kg).  Physical Exam  General: The patient is alert and cooperative at the time of the examination. The patient is moderately obese.  Eyes: Pupils are equal, round, and reactive to light. Discs are flat bilaterally.  Neck: The neck is supple, no carotid bruits are noted.  Respiratory: The respiratory examination is clear.  Cardiovascular: The cardiovascular examination reveals a regular rate and rhythm, no obvious murmurs or rubs are noted.  Skin: Extremities are without significant edema.  Neurologic Exam  Mental status: The patient is alert and oriented x 3 at the time of the examination. The patient has apparent normal recent and remote memory, with an apparently normal attention span and concentration ability.  Cranial nerves: Facial symmetry is present. There is good sensation of the face to pinprick and soft touch bilaterally. The strength of the facial muscles and the muscles to head turning and shoulder shrug are normal bilaterally. Speech is well enunciated, no aphasia or dysarthria is noted. Extraocular movements are full. Visual fields are full. The tongue is midline, and the patient has symmetric elevation of the soft palate. No obvious hearing deficits are noted.  Motor: The motor testing reveals 5 over 5 strength of  all 4 extremities. Good symmetric motor tone is noted throughout.  Sensory: Sensory testing is intact to pinprick, soft touch, vibration sensation, and position sense on all 4 extremities, with exception of some slight decrease in pinprick sensation on the left leg. No evidence of extinction is noted.  Coordination: Cerebellar testing reveals good finger-nose-finger and heel-to-shin bilaterally.  Gait and station: Gait is normal. Tandem gait is normal. Romberg is negative. No drift is seen.  Reflexes: Deep tendon reflexes are symmetric, but are slightly depressed bilaterally. Toes are downgoing bilaterally.   CT brain 07/28/16:  IMPRESSION: No acute intracranial abnormality.  * CT scan images were reviewed online. I agree with the written report.    Assessment/Plan:  1. Classic migraine  The patient has had a typical visual aura associated with migraine headache. The patient has had migraine throughout his adult life. He did have a CT scan of the brain done in the emergency room that was unremarkable. The patient will be placed on low-dose Topamax, he will follow-up in 4 months. He takes Motrin if needed for the migraine which seems to be helpful.  The migraine rarely lasts more than 2-3 hours.  Marlan Palau. Keith Laportia Carley MD 08/05/2016 11:01 AM  [facility] [address] [place], [place] [id number]  Phone [phone] Fax [phone]

## 2016-08-05 NOTE — Patient Instructions (Signed)
We will start Topamax for the headache.   Topamax (topiramate) is a seizure medication that has an FDA approval for seizures and for migraine headache. Potential side effects of this medication include weight loss, cognitive slowing, tingling in the fingers and toes, and carbonated drinks will taste bad. If any significant side effects are noted on this drug, please contact our office.  

## 2016-08-07 DIAGNOSIS — G4733 Obstructive sleep apnea (adult) (pediatric): Secondary | ICD-10-CM | POA: Diagnosis not present

## 2016-08-17 DIAGNOSIS — Z800 Family history of malignant neoplasm of digestive organs: Secondary | ICD-10-CM | POA: Diagnosis not present

## 2016-08-17 DIAGNOSIS — E108 Type 1 diabetes mellitus with unspecified complications: Secondary | ICD-10-CM | POA: Diagnosis not present

## 2016-08-17 DIAGNOSIS — Z23 Encounter for immunization: Secondary | ICD-10-CM | POA: Diagnosis not present

## 2016-08-17 DIAGNOSIS — Z794 Long term (current) use of insulin: Secondary | ICD-10-CM | POA: Diagnosis not present

## 2016-08-17 DIAGNOSIS — E109 Type 1 diabetes mellitus without complications: Secondary | ICD-10-CM | POA: Diagnosis not present

## 2016-08-17 DIAGNOSIS — Z0001 Encounter for general adult medical examination with abnormal findings: Secondary | ICD-10-CM | POA: Diagnosis not present

## 2016-08-17 DIAGNOSIS — E7800 Pure hypercholesterolemia, unspecified: Secondary | ICD-10-CM | POA: Diagnosis not present

## 2016-08-28 DIAGNOSIS — Z800 Family history of malignant neoplasm of digestive organs: Secondary | ICD-10-CM | POA: Diagnosis not present

## 2016-09-02 ENCOUNTER — Other Ambulatory Visit: Payer: Self-pay | Admitting: Internal Medicine

## 2016-09-02 MED FILL — CONTOUR NEXT STRIPS: 90 days supply | Qty: 700 | Fill #1

## 2016-09-02 NOTE — Telephone Encounter (Signed)
OK to submit

## 2016-09-02 NOTE — Telephone Encounter (Signed)
Okay to submit refill? Unsure if you send this in or not.  Thanks!

## 2016-09-03 MED FILL — LYRICA 50 MG CAPSULE: 50 | 90 days supply | Qty: 90 | Fill #0

## 2016-09-07 MED FILL — TOPIRAMATE 25 MG TABLET: 25 | 30 days supply | Qty: 60 | Fill #1

## 2016-09-09 DIAGNOSIS — G4733 Obstructive sleep apnea (adult) (pediatric): Secondary | ICD-10-CM | POA: Diagnosis not present

## 2016-09-25 MED FILL — ATORVASTATIN 20 MG TABLET: 20 | 90 days supply | Qty: 90 | Fill #0

## 2016-10-01 MED FILL — TOPIRAMATE 25 MG TABLET: 25 | 30 days supply | Qty: 60 | Fill #2

## 2016-10-02 MED FILL — PANTOPRAZOLE SOD DR 40 MG T: 40 | 90 days supply | Qty: 90 | Fill #0

## 2016-10-09 DIAGNOSIS — G4733 Obstructive sleep apnea (adult) (pediatric): Secondary | ICD-10-CM | POA: Diagnosis not present

## 2016-10-15 DIAGNOSIS — E108 Type 1 diabetes mellitus with unspecified complications: Secondary | ICD-10-CM | POA: Diagnosis not present

## 2016-10-27 ENCOUNTER — Other Ambulatory Visit: Payer: Self-pay | Admitting: Family Medicine

## 2016-10-27 DIAGNOSIS — N50812 Left testicular pain: Secondary | ICD-10-CM

## 2016-10-27 MED FILL — levoFLOXacin 500 MG TABS: 500 | 10 days supply | Qty: 10 | Fill #0

## 2016-10-29 ENCOUNTER — Ambulatory Visit
Admission: RE | Admit: 2016-10-29 | Discharge: 2016-10-29 | Disposition: A | Discharge status: Home / Self Care | Payer: 59 | Source: Ambulatory Visit | Attending: Family Medicine | Admitting: Family Medicine

## 2016-10-29 DIAGNOSIS — N50812 Left testicular pain: Secondary | ICD-10-CM | POA: Diagnosis not present

## 2016-10-29 DIAGNOSIS — N5082 Scrotal pain: Secondary | ICD-10-CM | POA: Diagnosis not present

## 2016-10-30 ENCOUNTER — Other Ambulatory Visit: Payer: 59

## 2016-11-02 ENCOUNTER — Other Ambulatory Visit: Payer: 59

## 2016-11-02 ENCOUNTER — Ambulatory Visit: Payer: 59 | Admitting: Internal Medicine

## 2016-11-06 DIAGNOSIS — E108 Type 1 diabetes mellitus with unspecified complications: Secondary | ICD-10-CM | POA: Diagnosis not present

## 2016-11-09 DIAGNOSIS — G4733 Obstructive sleep apnea (adult) (pediatric): Secondary | ICD-10-CM | POA: Diagnosis not present

## 2016-11-11 MED FILL — TOPIRAMATE 25 MG TAB: 25 | 30 days supply | Qty: 60 | Fill #3

## 2016-11-11 MED FILL — LISINOPRIL 5 MG TABLET: 5 | 90 days supply | Qty: 90 | Fill #0

## 2016-11-16 MED FILL — HumaLOG 100 UNIT/ML SOLN: 100 | 90 days supply | Qty: 90 | Fill #1

## 2016-11-26 DIAGNOSIS — Z23 Encounter for immunization: Secondary | ICD-10-CM | POA: Diagnosis not present

## 2016-12-02 MED FILL — LYRICA 50 MG CAPSULE: 50 | 90 days supply | Qty: 90 | Fill #1

## 2016-12-09 ENCOUNTER — Ambulatory Visit (INDEPENDENT_AMBULATORY_CARE_PROVIDER_SITE_OTHER): Payer: 59 | Admitting: Adult Health

## 2016-12-09 ENCOUNTER — Encounter: Payer: Self-pay | Admitting: Adult Health

## 2016-12-09 VITALS — BP 128/90 | HR 72 | Ht 69.0 in | Wt 253.8 lb

## 2016-12-09 DIAGNOSIS — G43109 Migraine with aura, not intractable, without status migrainosus: Secondary | ICD-10-CM | POA: Diagnosis not present

## 2016-12-09 DIAGNOSIS — G4733 Obstructive sleep apnea (adult) (pediatric): Secondary | ICD-10-CM | POA: Diagnosis not present

## 2016-12-09 MED ORDER — TOPIRAMATE 25 MG PO TABS: 50.0000 mg | ORAL_TABLET | Freq: Every day | ORAL | 11 refills | Status: DC

## 2016-12-09 MED FILL — TOPIRAMATE 25 MG TAB: 25 | 30 days supply | Qty: 60 | Fill #0

## 2016-12-09 NOTE — Progress Notes (Signed)
PATIENT: Kenneth [name] DOB: [birth year]  REASON FOR VISIT: follow up- migraine headaches HISTORY FROM: patient  HISTORY OF PRESENT ILLNESS: Today 12/09/16: Kenneth [name] is a 41 year old male with a history of migraine headaches. He returns today for follow-up. He is currently taking Topamax 50 mg at bedtime. He reports that he is tolerating the medication well. He states that since he started Topamax he has not had a migraine. He states occasionally once a week he'll get a dull headache however it only lasts 5-10 minutes. He denies any new neurological symptoms. He returns today for an evaluation.  HISTORY 08/05/16: Kenneth [name] is a 41 year old right-handed white male with a history of type 1 diabetes. The patient has a history of migraine headaches that began in high school and have continued throughout his life, he generally will have 2 or 3 headaches a week. The patient usually does not have any visual aura with the headache, but he did have an event that occurred on 07/28/2016. The patient had an eye-shaped visual disturbance that looked like he was looking through stained-glass. The visual disturbance was in both eyes, it was small initially but then enlarged gradually and then went away over 30 minutes. After the visual disturbance cleared he developed a left frontotemporal headache that was not severe. The patient did not have nausea or vomiting. The patient generally notes that his headaches are in the left frontotemporal region, occasionally they may be on the right. The patient usually does not have photophobia, phonophobia, nausea or vomiting with the headache. Occasionally when the headache is very severe, he may have some nausea. The patient denies any numbness or weakness of the face, arms, or legs. He may have some cognitive clouding with the headache. He claims that his father had hemiplegic migraine. 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 He was placed transiently on Dilantin but this resulted in hallucinations, and he is currently not on any anticonvulsant medications with exception of Lyrica that he takes for his diabetic peripheral neuropathy. The patient has been having daily headaches until yesterday following the emergency room visit. He is sent to this office for further evaluation  REVIEW OF SYSTEMS: Out of a complete 14 system review of symptoms, the patient complains only of the following symptoms, and all other reviewed systems are negative.  See history of present illness  ALLERGIES: Allergies  Allergen Reactions  . Dilantin [Phenytoin] Other (See Comments)    Hallucinations  . Gabapentin Other (See Comments)    Causes hallucinations    HOME MEDICATIONS: Outpatient Medications Prior to Visit  Medication Sig Dispense Refill  . aspirin 81 MG EC tablet Take by mouth.    Marland Kitchen atorvastatin (LIPITOR) 20 MG tablet   2  . cetirizine (ZYRTEC) 10 MG tablet Take 10 mg by mouth daily.    . cholecalciferol (VITAMIN D) 1000 units tablet Take 2,000 Units by mouth daily.    Marland Kitchen glucagon (GLUCAGON EMERGENCY) 1 MG injection Inject 1 mg into the muscle once as needed. 1 each prn  . glucose blood (BAYER CONTOUR NEXT TEST) test strip USE AS INSTRUCTED to text 7 times daily 700 each 3  . insulin lispro (HUMALOG) 100 UNIT/ML injection Use in insulin pump - up to 100 units a day 90 mL 3  . lisinopril (PRINIVIL,ZESTRIL) 5 MG tablet Take by mouth.    Marland Kitchen LYRICA 50 MG capsule TAKE 1 CAPSULE BY MOUTH ONCE  DAILY 90 capsule 1  . pantoprazole (PROTONIX) 40 MG tablet Take 40 mg by mouth daily.    Marland Kitchen topiramate (TOPAMAX) 25 MG tablet Take one tablet at night for one week, then take 2 tablets at night 60 tablet 3  . metFORMIN (GLUCOPHAGE-XR) 500 MG 24 hr tablet Take 1 tablet (500 mg total) by mouth 2 (two) times daily with a meal. (Patient not taking: Reported on 08/05/2016) 180 tablet 1   No facility-administered  medications prior to visit.     PAST MEDICAL HISTORY: Past Medical History:  Diagnosis Date  . Classic migraine 08/05/2016  . Depression, major, single episode   . Diabetes mellitus without complication (HCC)   . H/O cardiac catheterization    findings were normal  . Hypertension   . Injury of hip, left September 2015  . Sleep apnea     PAST SURGICAL HISTORY: Past Surgical History:  Procedure Laterality Date  . CORONARY ARTERY BYPASS GRAFT  Around 2011 or 2012  . VASECTOMY    . WISDOM TOOTH EXTRACTION     x8    FAMILY HISTORY: Family History  Problem Relation Age of Onset  . Arthritis Mother   . Leukemia Mother   . Diabetes Father   . Heart disease Father   . Cancer Father   . Migraines Father     SOCIAL HISTORY: Social History   Social History  . Marital status: Married    Spouse name: Aurther Loft  . Number of children: 3  . Years of education: Some college   Occupational History  . food [employer]   . Full time student    Social History Main Topics  . Smoking status: Never Smoker  . Smokeless tobacco: Never Used  . Alcohol use No  . Drug use: No  . Sexual activity: Not on file   Other Topics Concern  . Not on file   Social History Narrative   Lives with wife, son and mother   Caffeine use: Drinks decaf at home   Caffeine drinks out to eat- 4-5 times per week   Right handed      PHYSICAL EXAM  Vitals:   12/09/16 0855  BP: 128/90  Pulse: 72  Weight: 253 lb 12.8 oz (115.1 kg)  Height: 5\' 9"  (1.753 m)   Body mass index is 37.48 kg/m.  Generalized: Well developed, in no acute distress   Neurological examination  Mentation: Alert oriented to time, place, history taking. Follows all commands speech and language fluent Cranial nerve II-XII: Pupils were equal round reactive to light. Extraocular movements were full, visual field were full on confrontational test. Facial sensation and strength were normal. Uvula tongue midline. Head  turning and shoulder shrug  were normal and symmetric. Motor: The motor testing reveals 5 over 5 strength of all 4 extremities. Good symmetric motor tone is noted throughout.  Sensory: Sensory testing is intact to soft touch on all 4 extremities. No evidence of extinction is noted.  Coordination: Cerebellar testing reveals good finger-nose-finger and heel-to-shin bilaterally.  Gait and station: Gait is normal. Tandem gait is normal. Romberg is negative. No drift is seen.  Reflexes: Deep tendon reflexes are symmetric and normal bilaterally.   DIAGNOSTIC DATA (LABS, IMAGING, TESTING) - I reviewed patient records, labs, notes, testing and imaging myself where available.  Lab Results  Component Value Date   WBC 9.6 07/28/2016   HGB 14.6 07/28/2016   HCT 42.2 07/28/2016   MCV 81.5 07/28/2016   PLT  238 07/28/2016      Component Value Date/Time   NA 138 07/28/2016 1355   NA 138 01/29/2015   K 4.2 07/28/2016 1355   CL 105 07/28/2016 1355   CO2 27 07/28/2016 1355   GLUCOSE 206 (H) 07/28/2016 1355   BUN 15 07/28/2016 1355   BUN 10 01/29/2015   CREATININE 1.12 07/28/2016 1355   CREATININE 0.83 03/13/2016 1202   CALCIUM 8.9 07/28/2016 1355   PROT 6.8 03/13/2016 1202   ALBUMIN 4.1 03/13/2016 1202   AST 17 03/13/2016 1202   ALT 20 03/13/2016 1202   ALKPHOS 62 03/13/2016 1202   BILITOT 0.7 03/13/2016 1202   GFRNONAA >60 07/28/2016 1355   GFRNONAA >89 03/13/2016 1202   GFRAA >60 07/28/2016 1355   GFRAA >89 03/13/2016 1202   Lab Results  Component Value Date   CHOL 115 03/13/2016   HDL 42.60 03/13/2016   LDLCALC 60 03/13/2016   TRIG 65.0 03/13/2016   CHOLHDL 3 03/13/2016   Lab Results  Component Value Date   HGBA1C 7.2 06/23/2016   No results found for: [id number] Lab Results  Component Value Date   TSH 3.89 03/13/2016      ASSESSMENT AND PLAN 41 y.o. year old male  has a past medical history of Classic migraine (08/05/2016); Depression, major, single episode; Diabetes  mellitus without complication (HCC); H/O cardiac catheterization; Hypertension; Injury of hip, left (September 2015); and Sleep apnea. here with:  1. Migraine headaches  Overall the patient is doing well. He will continue on Topamax 50 mg at bedtime. I have advised that if his headache frequency or severity increases he should let us know. He will follow-up in 6 months or sooner if needed.  I spent 15 minutes with the patient. 50% of this time was spent discussing symptoms and his medication Topamax.    Butch PennyMegan Cerise Lieber, MSN, NP-C 12/09/2016, 9:08 AM [facility] [address], [address] [place], [place] [address] [phone]

## 2016-12-09 NOTE — Patient Instructions (Signed)
Your Plan:  Continue Topamax 50 mg at bedtime If your symptoms worsen or you develop new symptoms please let us know.    Thank you for coming to see us at [facility]. I hope we have been able to provide you high quality care today.  You may receive a patient satisfaction survey over the next few weeks. We would appreciate your feedback and comments so that we may continue to improve ourselves and the health of our patients.  

## 2016-12-09 NOTE — Progress Notes (Signed)
I have read the note, and I agree with the clinical assessment and plan.  Sander Remedios KEITH   

## 2016-12-26 ENCOUNTER — Other Ambulatory Visit: Payer: Self-pay | Admitting: *Deleted

## 2016-12-26 NOTE — Patient Outreach (Signed)
[facility] advising [facility] that his diabetes disease self management services will be transitioned from the [facility] program to [facility]. Also advised him that a letter will be mailed to him with more details regarding this transition. Encouraged him  to contact this RNCM for questions or concerns. Bary RichardJanet S. Rhonna Holster RN,CCM,CDE [facility] Care Management Coordinator [facility] and [facility] Office Phone [phone] Office Fax [phone]

## 2016-12-29 ENCOUNTER — Other Ambulatory Visit: Payer: Self-pay | Admitting: *Deleted

## 2016-12-29 NOTE — Patient Outreach (Addendum)
Spoke with Kenneth [name] to verify he received the e-mail advising him that disease self-management services will be transitioned from the [facility] program to either [facility] or [facility] in 2019 for all [facility] medical plan members. Reminded him to complete his health risk assessment on the [email or web address] website by the deadline of 12/31/16. Also advised him that a letter will be mailed to his home address with details of this transition. Will close case to [facility] To Wellness diabetes program. Bary RichardJanet S. Catherine Cubero RN,CCM,CDE [facility] Care Management Coordinator [facility] and [facility] Office Phone [phone] Office Fax [phone]

## 2016-12-30 MED FILL — ATORVASTATIN 20 MG TABLET: 20 | 90 days supply | Qty: 90 | Fill #1

## 2016-12-31 MED FILL — PANTOPRAZOLE SOD DR 40 MG T: 40 | 90 days supply | Qty: 90 | Fill #0

## 2017-01-08 DIAGNOSIS — G4733 Obstructive sleep apnea (adult) (pediatric): Secondary | ICD-10-CM | POA: Diagnosis not present

## 2017-01-14 MED FILL — TOPIRAMATE 25 MG TAB: 25 | 30 days supply | Qty: 60 | Fill #1

## 2017-01-16 DIAGNOSIS — E108 Type 1 diabetes mellitus with unspecified complications: Secondary | ICD-10-CM | POA: Diagnosis not present

## 2017-01-26 ENCOUNTER — Ambulatory Visit: Payer: 59 | Admitting: Internal Medicine

## 2017-01-31 DIAGNOSIS — E108 Type 1 diabetes mellitus with unspecified complications: Secondary | ICD-10-CM | POA: Diagnosis not present

## 2017-02-04 ENCOUNTER — Encounter: Payer: Self-pay | Admitting: Internal Medicine

## 2017-02-05 MED ORDER — INSULIN LISPRO 100 UNIT/ML ~~LOC~~ SOLN: SUBCUTANEOUS | 3 refills | Status: DC

## 2017-02-05 MED FILL — HumaLOG 100 UNIT/ML SOLN: 100 | 90 days supply | Qty: 90 | Fill #0

## 2017-02-08 DIAGNOSIS — G4733 Obstructive sleep apnea (adult) (pediatric): Secondary | ICD-10-CM | POA: Diagnosis not present

## 2017-02-17 MED FILL — LISINOPRIL 5 MG TABLET: 5 | 90 days supply | Qty: 90 | Fill #1

## 2017-02-17 MED FILL — TOPIRAMATE 25 MG TAB: 25 | 30 days supply | Qty: 60 | Fill #2

## 2017-02-18 MED FILL — CONTOUR NEXT STRIPS: 90 days supply | Qty: 700 | Fill #2

## 2017-02-19 DIAGNOSIS — M25512 Pain in left shoulder: Secondary | ICD-10-CM | POA: Diagnosis not present

## 2017-02-27 ENCOUNTER — Telehealth: Payer: 59 | Admitting: Family

## 2017-02-27 DIAGNOSIS — B9689 Other specified bacterial agents as the cause of diseases classified elsewhere: Secondary | ICD-10-CM

## 2017-02-27 DIAGNOSIS — J329 Chronic sinusitis, unspecified: Secondary | ICD-10-CM | POA: Diagnosis not present

## 2017-02-27 MED ORDER — BENZONATATE 100 MG PO CAPS: 100.0000 mg | ORAL_CAPSULE | Freq: Three times a day (TID) | ORAL | 0 refills | Status: DC | PRN

## 2017-02-27 MED ORDER — AMOXICILLIN-POT CLAVULANATE 875-125 MG PO TABS: 1.0000 | ORAL_TABLET | Freq: Two times a day (BID) | ORAL | 0 refills | Status: AC

## 2017-02-27 NOTE — Progress Notes (Signed)
Thank you for the details you included in the comment boxes. Those details are very helpful in determining the best course of treatment for you and help us to provide the best care. [place] We are sorry that you are not feeling well.  Here is how we plan to help!  Based on what you have shared with me it looks like you have sinusitis.  Sinusitis is inflammation and infection in the sinus cavities of the head.  Based on your presentation I believe you most likely have Acute Bacterial Sinusitis.  This is an infection caused by bacteria and is treated with antibiotics. I have prescribed Augmentin 875mg /125mg  one tablet twice daily with food, for 7 days.  You may use an oral decongestant such as Mucinex D or if you have glaucoma or high blood pressure use plain Mucinex. Saline nasal spray help and can safely be used as often as needed for congestion.  If you develop worsening sinus pain, fever or notice severe headache and vision changes, or if symptoms are not better after completion of antibiotic, please schedule an appointment with a health care provider.  I have also sent Tessalon Perles 100mg , take 1-2 every 8 hours for cough.   Sinus infections are not as easily transmitted as other respiratory infection, however we still recommend that you avoid close contact with loved ones, especially the very young and elderly.  Remember to wash your hands thoroughly throughout the day as this is the number one way to prevent the spread of infection!  Home Care:  Only take medications as instructed by your medical team.  Complete the entire course of an antibiotic.  Do not take these medications with alcohol.  A steam or ultrasonic humidifier can help congestion.  You can place a towel over your head and breathe in the steam from hot water coming from a faucet.  Avoid close contacts especially the very young and the elderly.  Cover your mouth when you cough or sneeze.  Always remember to wash your hands.  Get  Help Right Away If:  You develop worsening fever or sinus pain.  You develop a severe head ache or visual changes.  Your symptoms persist after you have completed your treatment plan.  Make sure you  Understand these instructions.  Will watch your condition.  Will get help right away if you are not doing well or get worse.  Your e-visit answers were reviewed by a board certified advanced clinical practitioner to complete your personal care plan.  Depending on the condition, your plan could have included both over the counter or prescription medications.  If there is a problem please reply  once you have received a response from your provider.  Your safety is important to us.  If you have drug allergies check your prescription carefully.    You can use MyChart to ask questions about today's visit, request a non-urgent call back, or ask for a work or school excuse for 24 hours related to this e-Visit. If it has been greater than 24 hours you will need to follow up with your provider, or enter a new e-Visit to address those concerns.  You will get an e-mail in the next two days asking about your experience.  I hope that your e-visit has been valuable and will speed your recovery. Thank you for using e-visits.

## 2017-03-01 ENCOUNTER — Other Ambulatory Visit: Payer: Self-pay | Admitting: Internal Medicine

## 2017-03-02 ENCOUNTER — Telehealth: Payer: Self-pay | Admitting: Internal Medicine

## 2017-03-02 MED FILL — LYRICA 50 MG CAPSULE: 50 | 90 days supply | Qty: 90 | Fill #0

## 2017-03-02 NOTE — Telephone Encounter (Signed)
Refaxed to pharmacy.

## 2017-03-02 NOTE — Telephone Encounter (Signed)
[name] you faxed over a prescription for this patient, and she can't read the drug name or the strength. Please resend. [phone]

## 2017-03-10 DIAGNOSIS — G4733 Obstructive sleep apnea (adult) (pediatric): Secondary | ICD-10-CM | POA: Diagnosis not present

## 2017-03-17 MED FILL — TOPIRAMATE 25 MG TAB: 25 | 30 days supply | Qty: 60 | Fill #3

## 2017-03-22 DIAGNOSIS — J0100 Acute maxillary sinusitis, unspecified: Secondary | ICD-10-CM | POA: Diagnosis not present

## 2017-03-23 ENCOUNTER — Encounter: Payer: Self-pay | Admitting: Internal Medicine

## 2017-03-23 ENCOUNTER — Ambulatory Visit: Payer: 59 | Admitting: Internal Medicine

## 2017-03-23 VITALS — BP 130/82 | HR 100 | Ht 69.0 in | Wt 251.0 lb

## 2017-03-23 DIAGNOSIS — E1042 Type 1 diabetes mellitus with diabetic polyneuropathy: Secondary | ICD-10-CM

## 2017-03-23 LAB — POCT GLYCOSYLATED HEMOGLOBIN (HGB A1C): HEMOGLOBIN A1C: 7.9

## 2017-03-23 NOTE — Progress Notes (Signed)
Patient ID: Kenneth [name], male   DOB: 1975-12-16, 42 y.o.   MRN: [id number]  HPI: Kenneth [name] is a 42 y.o.-year-old male, initially referred by his PCP, Dr. Radene Ou, returning for f/u for DM1, dx 86 (age 56), uncontrolled, with complications (PN). He previously saw Dr. Meredith Pel and Dr. Hartford Poli. Last visit with me 8 mo ago.  Last hemoglobin A1c was: Lab Results  Component Value Date   HGBA1C 7.2 06/23/2016   HGBA1C 8.2 03/13/2016   HGBA1C 8.0 10/29/2015   Pt is on an insulin pump: - Medtronic 670G - since 01/2016 (but recently obtained a replacement pump), + CGM since 02/2016. Insulin: FiAsp. [place] - supplies.  He was also on Metformin ER 500 mg bid >> nausea >> stopped.  Pump settings: - basal rates: 12 am: 1.3 units/h >> 1.4 8 am: 1.35 >> 1.4 4 pm: 1.3 >> 1.4 9 pm: 1.1 >> 1.2 - ICR:  12 am: 5 6 am: 4.5 >> 5 11:30 am: 4 >> 5 6 pm: 5 >> 6.5 - target: 110-120  - ISF:   12 am: 40 - Insulin on Board: 4h - bolus wizard: on TDD from basal insulin: 54% >> 52% TDD from bolus insulin: 46% >> 48% Total daily dose up to 100 units.  - changes infusion site: q3 days - Meter: Bayer Contour  Pt checks his sugars 3.3x a day (will scan report).  CGM alarms are: 65-250 respectively.  CGM parameters: - Average from CGM: 153+/-53 >> 156+/-47 >> 186+/-53 - Average from manual BG checks: 180+/-66 >> 178+/-64 >> 218+/-72  Time in range:  - very low (40-50): 1%>> 0% >> 0% - low (50-70): 2% >> 1% >> 0% - normal range (70-180): 68% >> 70% >> 49% - high sugars (180-250): 24% >> 26% >> 37% - very high sugars (250-400): 5% >> 3% >> 14%   - in auto mode: 86% >> 71% >> 0% - in manual mode: 14% >> 29% >> 100%  Lowest sugar was 39 (miscalculated carbs the night before) >> ... >> 81 >> 55; he has hypoglycemia awareness at 70. No previous. hypoglycemia admission. He has a glucagon kit. 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 Highest sugar was 300 >> 400.  No previous DKA admission.  Pt's meals are: - Breakfast: snack cake, yoghurt - Lunch: sandwich, frozen foods - Dinner: pizza, tacos, steak, veggies, hot dogs - Snacks: chips, ice cream, yoghurt, fruit, 3-4x a day Exercises 3x a week.   - No h/o CKD, last BUN/creatinine:  Lab Results  Component Value Date   BUN 15 07/28/2016   CREATININE 1.12 07/28/2016  07/27/2014: ACR 3.2. 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 On Lisinopril. - + HL; last set of lipids: Lab Results  Component Value Date   CHOL 115 03/13/2016   HDL 42.60 03/13/2016   LDLCALC 60 03/13/2016   TRIG 65.0 03/13/2016   CHOLHDL 3 03/13/2016  On Lipitor. - last eye exam was in 04/2016 >> No DR. + bilateral cataracts. - + numbness and tingling in his feet. On Lyrica >> helping  Last TSH normal: Lab Results  Component Value Date   TSH 3.89 03/13/2016   ROS: Constitutional: no weight gain/no weight loss, + fatigue, no subjective hyperthermia, no subjective hypothermia Eyes: no blurry vision, no xerophthalmia ENT: no sore throat, no nodules palpated in throat, no dysphagia, no odynophagia, no hoarseness Cardiovascular: no CP/+ SOB/no palpitations/no leg swelling Respiratory: + cough/+ SOB/no wheezing Gastrointestinal: +  N/no V/no D/no C/+ acid reflux Musculoskeletal: no muscle aches/no joint aches Skin: no rashes, no hair loss Neurological: no tremors/+ numbness/+ tingling/no dizziness, + HA  I reviewed pt's medications, allergies, PMH, social hx, family hx, and changes were documented in the history of present illness. Otherwise, unchanged from my initial visit note.  Past Medical History:  Diagnosis Date  . Classic migraine 08/05/2016  . Depression, major, single episode   . Diabetes mellitus without complication ([place])   . H/O cardiac catheterization    findings were normal  . Hypertension   . Injury of hip, left September 2015  . Sleep apnea    Past Surgical History:  Procedure Laterality Date  .  CORONARY ARTERY BYPASS GRAFT  Around 2011 or 2012  . VASECTOMY    . WISDOM TOOTH EXTRACTION     x8   Social History   Social History  . Marital Status: Married    Spouse Name: N/A  . Number of Children: 3   Occupational History  . student   Social History Main Topics  . Smoking status: Never Smoker   . Smokeless tobacco: Never Used  . Alcohol Use: No  . Drug Use: No   Current Outpatient Medications on File Prior to Visit  Medication Sig Dispense Refill  . aspirin 81 MG EC tablet Take by mouth.    Marland Kitchen atorvastatin (LIPITOR) 20 MG tablet   2  . benzonatate (TESSALON PERLES) 100 MG capsule Take 1-2 capsules (100-200 mg total) by mouth every 8 (eight) hours as needed for cough. 30 capsule 0  . cetirizine (ZYRTEC) 10 MG tablet Take 10 mg by mouth daily.    . cholecalciferol (VITAMIN D) 1000 units tablet Take 2,000 Units by mouth daily.    Marland Kitchen glucagon (GLUCAGON EMERGENCY) 1 MG injection Inject 1 mg into the muscle once as needed. 1 each prn  . glucose blood (BAYER CONTOUR NEXT TEST) test strip USE AS INSTRUCTED to text 7 times daily 700 each 3  . insulin lispro (HUMALOG) 100 UNIT/ML injection Use in insulin pump - up to 100 units a day 90 mL 3  . lisinopril (PRINIVIL,ZESTRIL) 5 MG tablet Take by mouth.    Marland Kitchen LYRICA 50 MG capsule TAKE 1 CAPSULE BY MOUTH ONCE DAILY 90 capsule 0  . pantoprazole (PROTONIX) 40 MG tablet Take 40 mg by mouth daily.    Marland Kitchen topiramate (TOPAMAX) 25 MG tablet Take 2 tablets (50 mg total) by mouth at bedtime. 60 tablet 11   No current facility-administered medications on file prior to visit.    Allergies  Allergen Reactions  . Dilantin [Phenytoin] Other (See Comments)    Hallucinations  . Gabapentin Other (See Comments)    Causes hallucinations   Family History  Problem Relation Age of Onset  . Arthritis Mother   . Leukemia Mother   . Diabetes Father   . Heart disease Father   . Cancer Father   . Migraines Father    PE: BP 130/82   Pulse 100   Ht 5'  9" (1.753 m)   Wt 251 lb (113.9 kg)   SpO2 96%   BMI 37.07 kg/m  Wt Readings from Last 3 Encounters:  03/23/17 251 lb (113.9 kg)  12/09/16 253 lb 12.8 oz (115.1 kg)  08/05/16 256 lb 8 oz (116.3 kg)   Constitutional: overweight, in NAD Eyes: PERRLA, EOMI, no exophthalmos ENT: moist mucous membranes, no thyromegaly, no cervical lymphadenopathy Cardiovascular: tachycardia, RR, No MRG Respiratory: CTA B Gastrointestinal: abdomen  soft, NT, ND, BS+ Musculoskeletal: no deformities, strength intact in all 4 Skin: moist, warm, no rashes Neurological: no tremor with outstretched hands, DTR normal in all 4   ASSESSMENT: 1. DM1, uncontrolled, with complications - PN  45/40/9811: C-peptide<0.1 (1.1-4.4 ng/mL), Glucose 176   PLAN:  1. Patient with long standing, uncontrolled, DM1, on Medtronic 670 G.  He recently received a replacement pump.  We downloaded this and we obtained 2 weeks of data from before changing to the new pump (up to 03/23/2017): His sugars are worse compared to before, with a higher proportion of sugars in the higher ranges.  He complains of being kicked out of the automatic mode more.  We discussed about calling the Medtronic company to see if his sensors are among the ones that were recalled. - We reviewed his pump and CGM downloads and it appears that his insulin to carb ratios are adequate.  He does not have a significant increase or decrease in blood sugars after meals.  We will not change his ICRs today. - A consistent pattern that he noticed and this is also noticeable on his pump downloads his increased sugars as the night goes on.  His sugars are mostly during the night and in the morning, and occasionally later in the day  We will increase his basal rates during the night. - He is doing a great job changing his infusion sites every 3 days as advised. - He is also doing a good job giving himself insulin based on the bolus wizard recommendations, and no  manual boluses -  We does not perform rage boluses, does not need to introduce phantom carbs. - No other changes are needed for now - I suggested to:  Patient Instructions  Please continue: - basal rates: 12 am: 1.4 >> 1.55 8 am: 1.4 4 pm: 1.4 9 pm: 1.2 >> 1.3 - ICR:  12 am: 5.5 6 am: 5 11:30 am: 5 6 pm: 6.5 - target: 110-120  - ISF:   12 am: 40 - Insulin on Board: 4h  Please return in 3 months with your sugar log.   - today, HbA1c is 7.9% (higher) - continue checking sugars at different times of the day - check 4x a day (and calibrates then), rotating checks - advised for yearly eye exams >> he is UTD - We will check his annual labs at next visit - Return to clinic in 3 mo with sugar log    - time spent with the patient: 40 min, of which >50% was spent in reviewing his pump and CGM downloads, discussing his hyper-glycemic episodes, reviewing previous labs and pump settings and developing a plan to avoid hypo- and hyper-glycemia.   Philemon Kingdom, MD PhD [facility] Endocrin[facility]

## 2017-03-23 NOTE — Patient Instructions (Addendum)
Please continue: - basal rates: 12 am: 1.4 >> 1.55 8 am: 1.4 4 pm: 1.4 9 pm: 1.2 >> 1.3 - ICR:  12 am: 5.5 6 am: 5 11:30 am: 5 6 pm: 6.5 - target: 110-120  - ISF:   12 am: 40 - Insulin on Board: 4h  Please return in 3 months with your sugar log.

## 2017-03-23 NOTE — Addendum Note (Signed)
Addended by: Bobbye RiggsWALKER, Janashia Parco L on: 03/23/2017 10:36 AM   Modules accepted: Orders

## 2017-03-30 MED FILL — ATORVASTATIN 20 MG TABLET: 20 | 90 days supply | Qty: 90 | Fill #2

## 2017-03-30 MED FILL — PANTOPRAZOLE SOD DR 40 MG T: 40 | 90 days supply | Qty: 90 | Fill #1

## 2017-04-07 DIAGNOSIS — E108 Type 1 diabetes mellitus with unspecified complications: Secondary | ICD-10-CM | POA: Diagnosis not present

## 2017-04-09 DIAGNOSIS — G4733 Obstructive sleep apnea (adult) (pediatric): Secondary | ICD-10-CM | POA: Diagnosis not present

## 2017-04-15 MED FILL — TOPIRAMATE 25 MG TAB: 25 | 30 days supply | Qty: 60 | Fill #4

## 2017-05-03 MED FILL — HumaLOG 100 UNIT/ML SOLN: 100 | 90 days supply | Qty: 90 | Fill #1

## 2017-05-10 DIAGNOSIS — G4733 Obstructive sleep apnea (adult) (pediatric): Secondary | ICD-10-CM | POA: Diagnosis not present

## 2017-05-10 DIAGNOSIS — E108 Type 1 diabetes mellitus with unspecified complications: Secondary | ICD-10-CM | POA: Diagnosis not present

## 2017-05-12 ENCOUNTER — Ambulatory Visit (INDEPENDENT_AMBULATORY_CARE_PROVIDER_SITE_OTHER): Payer: 59 | Admitting: Orthopedic Surgery

## 2017-05-19 MED FILL — TOPIRAMATE 25 MG TAB: 25 | 30 days supply | Qty: 60 | Fill #5

## 2017-05-19 MED FILL — LISINOPRIL 5 MG TABLET: 5 | 90 days supply | Qty: 90 | Fill #0

## 2017-05-21 ENCOUNTER — Ambulatory Visit (INDEPENDENT_AMBULATORY_CARE_PROVIDER_SITE_OTHER): Payer: 59 | Admitting: Orthopedic Surgery

## 2017-05-21 ENCOUNTER — Encounter (INDEPENDENT_AMBULATORY_CARE_PROVIDER_SITE_OTHER): Payer: Self-pay | Admitting: Orthopedic Surgery

## 2017-05-21 DIAGNOSIS — M7502 Adhesive capsulitis of left shoulder: Secondary | ICD-10-CM

## 2017-05-21 DIAGNOSIS — M25512 Pain in left shoulder: Secondary | ICD-10-CM | POA: Diagnosis not present

## 2017-05-21 NOTE — Progress Notes (Signed)
Office Visit Note   Patient: Kenneth [name]           Date of Birth: March 11, 1975           MRN: [id number] Visit Date: 05/21/2017 Requested by: Kenneth Heron, MD [address] [place], [place] [address] PCP: Kenneth Heron, MD  Subjective: Chief Complaint  Patient presents with  . Left Shoulder - Pain, Numbness    HPI: Kenneth [name] is a patient with 3-6 months of left shoulder pain.  Denies any history of injury.  Localizes pain primarily anterior in the superior aspect of the shoulder.  Denies any weakness.  Hard for him to sleep on the left-hand side.  Denies any neck pain or numbness and tingling.  He does not report any mechanical symptoms of grinding or popping.  He has had diabetes for 21 years and has an insulin pump.  Outside radiographs reviewed and are unremarkable.              ROS: All systems reviewed are negative as they relate to the chief complaint within the history of present illness.  Patient denies  fevers or chills.   Assessment & Plan: Visit Diagnoses:  1. Left shoulder pain, unspecified chronicity   2. Adhesive capsulitis of left shoulder     Plan: Impression is left frozen shoulder early.  Plan is home exercise program of stretching and strengthening but primarily stretching.  Would also like to refer him to Dr. Alvester [name] for left shoulder intra-articular cortisone injection.  I will see him back if this is worsened.  [name]'s primary concern today was that he had a rotator cuff tear but his cuff strength is excellent.  He does have an early frozen shoulder with some limitation of passive range of motion.  Follow-Up Instructions: Return if symptoms worsen or fail to improve.   Orders:  Orders Placed This Encounter  Procedures  . Ambulatory referral to Physical Medicine Rehab   No orders of the defined types were placed in this encounter.     Procedures: No procedures performed   Clinical Data: No additional findings.  Objective: Vital Signs:  There were no vitals taken for this visit.  Physical Exam:   Constitutional: Patient appears well-developed HEENT:  Head: Normocephalic Eyes:EOM are normal Neck: Normal range of motion Cardiovascular: Normal rate Pulmonary/chest: Effort normal Neurologic: Patient is alert Skin: Skin is warm Psychiatric: Patient has normal mood and affect    Ortho Exam: Orthopedic exam demonstrates good cervical spine range of motion.  5 out of 5 grip EPL FPL interosseous wrist flexion extension biceps triceps and deltoid strength.  Left shoulder exam demonstrates external rotation at 15 degrees of abduction to about 50 on the right and 25 on the left.  No real restriction of isolated glenohumeral abduction on the left compared to the right.  Forward flexion is about 10 degrees less on the left compared to the right.  Rotator cuff strength is excellent to isolated infraspinatus supraspinatus and subscap muscle testing.  No other masses lymphadenopathy or skin changes noted in that shoulder region.  Negative O'Brien's testing and no discrete AC joint tenderness is present.  Specialty Comments:  No specialty comments available.  Imaging: No results found.   PMFS History: Patient Active Problem List   Diagnosis Date Noted  . Classic migraine 08/05/2016  . DM type 1 with diabetic peripheral neuropathy (HCC) 06/27/2015   Past Medical History:  Diagnosis Date  . Classic migraine 08/05/2016  . Depression, major,  single episode   . Diabetes mellitus without complication (HCC)   . H/O cardiac catheterization    findings were normal  . Hypertension   . Injury of hip, left September 2015  . Sleep apnea     Family History  Problem Relation Age of Onset  . Arthritis Mother   . Leukemia Mother   . Diabetes Father   . Heart disease Father   . Cancer Father   . Migraines Father     Past Surgical History:  Procedure Laterality Date  . CORONARY ARTERY BYPASS GRAFT  Around 2011 or 2012  . VASECTOMY     . WISDOM TOOTH EXTRACTION     x8   Social History   Occupational History  . 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 Occupation: Full time student  Tobacco Use  . Smoking status: Never Smoker  . Smokeless tobacco: Never Used  Substance and Sexual Activity  . Alcohol use: No  . Drug use: No  . Sexual activity: Not on file

## 2017-06-02 ENCOUNTER — Other Ambulatory Visit: Payer: Self-pay | Admitting: Internal Medicine

## 2017-06-09 DIAGNOSIS — G4733 Obstructive sleep apnea (adult) (pediatric): Secondary | ICD-10-CM | POA: Diagnosis not present

## 2017-06-10 ENCOUNTER — Ambulatory Visit (INDEPENDENT_AMBULATORY_CARE_PROVIDER_SITE_OTHER): Payer: 59 | Admitting: Adult Health

## 2017-06-10 ENCOUNTER — Other Ambulatory Visit: Payer: Self-pay | Admitting: Internal Medicine

## 2017-06-10 ENCOUNTER — Encounter: Payer: Self-pay | Admitting: Adult Health

## 2017-06-10 VITALS — BP 108/73 | HR 72 | Ht 69.0 in | Wt 252.2 lb

## 2017-06-10 DIAGNOSIS — G43109 Migraine with aura, not intractable, without status migrainosus: Secondary | ICD-10-CM

## 2017-06-10 MED ORDER — TOPIRAMATE 50 MG PO TABS: 50.0000 mg | ORAL_TABLET | Freq: Every day | ORAL | 3 refills | Status: DC

## 2017-06-10 MED FILL — TOPIRAMATE 50 MG TABLET: 50 | 90 days supply | Qty: 90 | Fill #0

## 2017-06-10 NOTE — Progress Notes (Signed)
PATIENT: Kenneth [name] DOB: 1976-02-11  REASON FOR VISIT: follow up HISTORY FROM: patient  HISTORY OF PRESENT ILLNESS: Today 06/10/17: Kenneth [name] is a 42 year old male with a history of migraine headaches.  He returns today for follow-up.  He states that his headaches remain under good control with Topamax.  He continues on Topamax 50 mg at bedtime.  He reports on occasion he will have a dull headache but this typically resolves fairly quickly with over-the-counter medication.  He states that he did break his glasses and was unable to wear them for 1 month.  He reports during this time his headache frequency increased.  Overall the patient has done well.  He returns today for an evaluation.  HISTORY 12/09/16: Kenneth [name] is a 42 year old male with a history of migraine headaches. He returns today for follow-up. He is currently taking Topamax 50 mg at bedtime. He reports that he is tolerating the medication well. He states that since he started Topamax he has not had a migraine. He states occasionally once a week he'll get a dull headache however it only lasts 5-10 minutes. He denies any new neurological symptoms. He returns today for an evaluation.   REVIEW OF SYSTEMS: Out of a complete 14 system review of symptoms, the patient complains only of the following symptoms, and all other reviewed systems are negative.  Blurred vision ALLERGIES: Allergies  Allergen Reactions  . Dilantin [Phenytoin] Other (See Comments)    Hallucinations  . Gabapentin Other (See Comments)    Causes hallucinations    HOME MEDICATIONS: Outpatient Medications Prior to Visit  Medication Sig Dispense Refill  . aspirin 81 MG EC tablet Take by mouth.    Marland Kitchen atorvastatin (LIPITOR) 20 MG tablet   2  . cetirizine (ZYRTEC) 10 MG tablet Take 10 mg by mouth daily.    . cholecalciferol (VITAMIN D) 1000 units tablet Take 2,000 Units by mouth daily.    Marland Kitchen glucagon (GLUCAGON EMERGENCY) 1 MG injection Inject 1 mg into  the muscle once as needed. 1 each prn  . glucose blood (BAYER CONTOUR NEXT TEST) test strip USE AS INSTRUCTED to text 7 times daily 700 each 3  . insulin lispro (HUMALOG) 100 UNIT/ML injection Use in insulin pump - up to 100 units a day 90 mL 3  . lisinopril (PRINIVIL,ZESTRIL) 5 MG tablet Take by mouth.    Marland Kitchen LYRICA 50 MG capsule TAKE 1 CAPSULE BY MOUTH ONCE DAILY 90 capsule 0  . pantoprazole (PROTONIX) 40 MG tablet Take 40 mg by mouth daily.    Marland Kitchen topiramate (TOPAMAX) 25 MG tablet Take 2 tablets (50 mg total) by mouth at bedtime. 60 tablet 11   No facility-administered medications prior to visit.     PAST MEDICAL HISTORY: Past Medical History:  Diagnosis Date  . Classic migraine 08/05/2016  . Depression, major, single episode   . Diabetes mellitus without complication (HCC)   . H/O cardiac catheterization    findings were normal  . Hypertension   . Injury of hip, left September 2015  . Sleep apnea     PAST SURGICAL HISTORY: Past Surgical History:  Procedure Laterality Date  . CORONARY ARTERY BYPASS GRAFT  Around 2011 or 2012  . VASECTOMY    . WISDOM TOOTH EXTRACTION     x8    FAMILY HISTORY: Family History  Problem Relation Age of Onset  . Arthritis Mother   . Leukemia Mother   . Diabetes Father   . Heart disease Father   .  Cancer Father   . Migraines Father     SOCIAL HISTORY: Social History   Socioeconomic History  . Marital status: Married    Spouse name: Aurther Loft  . Number of children: 3  . Years of education: Some college  . Highest education level: Not on file  Occupational History  . 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 Occupation: Full time student  Social Needs  . Financial resource strain: Not on file  . Food insecurity:    Worry: Not on file    Inability: Not on file  . Transportation needs:    Medical: Not on file    Non-medical: Not on file  Tobacco Use  . Smoking status: Never Smoker  . Smokeless tobacco: Never Used  Substance and Sexual  Activity  . Alcohol use: No  . Drug use: No  . Sexual activity: Not on file  Lifestyle  . Physical activity:    Days per week: Not on file    Minutes per session: Not on file  . Stress: Not on file  Relationships  . Social connections:    Talks on phone: Not on file    Gets together: Not on file    Attends religious service: Not on file    Active member of club or organization: Not on file    Attends meetings of clubs or organizations: Not on file    Relationship status: Not on file  . Intimate partner violence:    Fear of current or ex partner: Not on file    Emotionally abused: Not on file    Physically abused: Not on file    Forced sexual activity: Not on file  Other Topics Concern  . Not on file  Social History Narrative   Lives with wife, son and mother   Caffeine use: Drinks decaf at home   Caffeine drinks out to eat- 4-5 times per week   Right handed      PHYSICAL EXAM  Vitals:   06/10/17 1317  BP: 108/73  Pulse: 72  Weight: 252 lb 3.2 oz (114.4 kg)  Height: 5\' 9"  (1.753 m)   Body mass index is 37.24 kg/m.  Generalized: Well developed, in no acute distress   Neurological examination  Mentation: Alert oriented to time, place, history taking. Follows all commands speech and language fluent Cranial nerve II-XII: Pupils were equal round reactive to light. Extraocular movements were full, visual field were full on confrontational test. Facial sensation and strength were normal. Uvula tongue midline. Head turning and shoulder shrug  were normal and symmetric. Motor: The motor testing reveals 5 over 5 strength of all 4 extremities. Good symmetric motor tone is noted throughout.  Sensory: Sensory testing is intact to soft touch on all 4 extremities. No evidence of extinction is noted.  Coordination: Cerebellar testing reveals good finger-nose-finger and heel-to-shin bilaterally.  Gait and station: Gait is normal. Tandem gait is normal. Romberg is negative. No drift  is seen.  Reflexes: Deep tendon reflexes are symmetric and normal bilaterally.   DIAGNOSTIC DATA (LABS, IMAGING, TESTING) - I reviewed patient records, labs, notes, testing and imaging myself where available.  Lab Results  Component Value Date   WBC 9.6 07/28/2016   HGB 14.6 07/28/2016   HCT 42.2 07/28/2016   MCV 81.5 07/28/2016   PLT 238 07/28/2016      Component Value Date/Time   NA 138 07/28/2016 1355   NA 138 01/29/2015   K 4.2 07/28/2016 1355   CL  105 07/28/2016 1355   CO2 27 07/28/2016 1355   GLUCOSE 206 (H) 07/28/2016 1355   BUN 15 07/28/2016 1355   BUN 10 01/29/2015   CREATININE 1.12 07/28/2016 1355   CREATININE 0.83 03/13/2016 1202   CALCIUM 8.9 07/28/2016 1355   PROT 6.8 03/13/2016 1202   ALBUMIN 4.1 03/13/2016 1202   AST 17 03/13/2016 1202   ALT 20 03/13/2016 1202   ALKPHOS 62 03/13/2016 1202   BILITOT 0.7 03/13/2016 1202   GFRNONAA >60 07/28/2016 1355   GFRNONAA >89 03/13/2016 1202   GFRAA >60 07/28/2016 1355   GFRAA >89 03/13/2016 1202   Lab Results  Component Value Date   CHOL 115 03/13/2016   HDL 42.60 03/13/2016   LDLCALC 60 03/13/2016   TRIG 65.0 03/13/2016   CHOLHDL 3 03/13/2016   Lab Results  Component Value Date   HGBA1C 7.9 03/23/2017   No results found for: [id number] Lab Results  Component Value Date   TSH 3.89 03/13/2016      ASSESSMENT AND PLAN 42 y.o. year old male  has a past medical history of Classic migraine (08/05/2016), Depression, major, single episode, Diabetes mellitus without complication (HCC), H/O cardiac catheterization, Hypertension, Injury of hip, left (September 2015), and Sleep apnea. here with:  1.  Migraine headaches  Overall the patient is doing well.  He will continue on Topamax 50 mg at bedtime.  He is advised that if his symptoms worsen or he develops new symptoms he should let us know.  He will follow-up in 6 months or sooner if needed.     Butch PennyMegan Lasharn Bufkin, MSN, NP-C 06/10/2017, 1:31 PM [facility] [address], [address] [place], [place] [address] [phone]  I spent 15 minutes with the patient. 50% of this time was spent discussing headache frequency and medication

## 2017-06-10 NOTE — Patient Instructions (Signed)
Your Plan:  Continue Topamax 50 mg at bedtime If your symptoms worsen or you develop new symptoms please let us know.    Thank you for coming to see us at [facility]. I hope we have been able to provide you high quality care today.  You may receive a patient satisfaction survey over the next few weeks. We would appreciate your feedback and comments so that we may continue to improve ourselves and the health of our patients.  

## 2017-06-10 NOTE — Progress Notes (Signed)
I have read the note, and I agree with the clinical assessment and plan.  Taylan Mayhan K Joon Pohle   

## 2017-06-11 ENCOUNTER — Ambulatory Visit (INDEPENDENT_AMBULATORY_CARE_PROVIDER_SITE_OTHER): Payer: 59

## 2017-06-11 ENCOUNTER — Encounter (INDEPENDENT_AMBULATORY_CARE_PROVIDER_SITE_OTHER): Payer: Self-pay | Admitting: Physical Medicine and Rehabilitation

## 2017-06-11 ENCOUNTER — Ambulatory Visit (INDEPENDENT_AMBULATORY_CARE_PROVIDER_SITE_OTHER): Payer: 59 | Admitting: Physical Medicine and Rehabilitation

## 2017-06-11 ENCOUNTER — Other Ambulatory Visit: Payer: Self-pay | Admitting: Internal Medicine

## 2017-06-11 DIAGNOSIS — G8929 Other chronic pain: Secondary | ICD-10-CM

## 2017-06-11 DIAGNOSIS — M25512 Pain in left shoulder: Secondary | ICD-10-CM | POA: Diagnosis not present

## 2017-06-11 NOTE — Progress Notes (Signed)
.  Numeric Pain Rating Scale and Functional Assessment Average Pain 5   In the last MONTH (on 0-10 scale) has pain interfered with the following?  1. General activity like being  able to carry out your everyday physical activities such as walking, climbing stairs, carrying groceries, or moving a chair?  Rating(2)   +Driver, -BT, -Dye Allergies.   

## 2017-06-11 NOTE — Progress Notes (Signed)
Kenneth LovelessMatthew W [name] - 42 y.o. male MRN [id number]  Date of birth: 11-28-1975  Office Visit Note: Visit Date: 06/11/2017 PCP: Clayborn Heronankins, Victoria R, MD Referred by: Clayborn Heronankins, Victoria R, MD  Subjective: Chief Complaint  Patient presents with  . Left Elbow - Pain  . Left Shoulder - Pain   HPI: Kenneth [name] is a 42 year old gentleman that comes in today at the request of Dr. Burnard BuntingG. Scott Dean for diagnostic and therapeutic anesthetic left glenohumeral arthrogram.  Patient has chronic shoulder pain with decreased range of motion.  Diagnosis of possible adhesive capsulitis.   ROS Otherwise per HPI.  Assessment & Plan: Visit Diagnoses:  1. Chronic left shoulder pain     Plan: Findings:  Diagnostic note for therapeutic anesthetic arthrogram of the left elbow joint.  Patient did have relief during the anesthetic phase with increased range of motion.    Meds & Orders: No orders of the defined types were placed in this encounter.   Orders Placed This Encounter  Procedures  . Large Joint Inj: L glenohumeral  . XR C-ARM NO REPORT    Follow-up: No follow-ups on file.   Procedures: Large Joint Inj: L glenohumeral on 06/11/2017 8:22 AM Indications: pain and diagnostic evaluation Details: 22 G 3.5 in needle, anteromedial approach  Arthrogram: Yes  Medications: 80 mg triamcinolone acetonide 40 MG/ML; 3 mL bupivacaine 0.5 %  Arthrogram demonstrated excellent flow of contrast throughout the joint surface without extravasation or obvious defect.  The patient had relief of symptoms during the anesthetic phase of the injection.  Procedure, treatment alternatives, risks and benefits explained, specific risks discussed. Consent was given by the patient. Immediately prior to procedure a time out was called to verify the correct patient, procedure, equipment, support staff and site/side marked as required. Patient was prepped and draped in the usual sterile fashion.      No notes on file   Clinical  History: No specialty comments available.   He reports that he has never smoked. He has never used smokeless tobacco.  Recent Labs    06/23/16 0930 03/23/17 1035  HGBA1C 7.2 7.9    Objective:  VS:  HT:    WT:   BMI:     BP:   HR: bpm  TEMP: ( )  RESP:  Physical Exam  Ortho Exam Imaging: No results found.  Past Medical/Family/Surgical/Social History: Medications & Allergies reviewed per EMR, new medications updated. Patient Active Problem List   Diagnosis Date Noted  . Classic migraine 08/05/2016  . DM type 1 with diabetic peripheral neuropathy (HCC) 06/27/2015   Past Medical History:  Diagnosis Date  . Classic migraine 08/05/2016  . Depression, major, single episode   . Diabetes mellitus without complication (HCC)   . H/O cardiac catheterization    findings were normal  . Hypertension   . Injury of hip, left September 2015  . Sleep apnea    Family History  Problem Relation Age of Onset  . Arthritis Mother   . Leukemia Mother   . Diabetes Father   . Heart disease Father   . Cancer Father   . Migraines Father    Past Surgical History:  Procedure Laterality Date  . CORONARY ARTERY BYPASS GRAFT  Around 2011 or 2012  . VASECTOMY    . WISDOM TOOTH EXTRACTION     x8   Social History   Occupational History  . 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 Occupation: Full time student  Tobacco Use  .  Smoking status: Never Smoker  . Smokeless tobacco: Never Used  Substance and Sexual Activity  . Alcohol use: No  . Drug use: No  . Sexual activity: Not on file

## 2017-06-11 NOTE — Patient Instructions (Signed)

## 2017-06-11 NOTE — Telephone Encounter (Signed)
Pt called about needing refill for medication LYRICA 50 MG capsule  PH[facility]Y [facility] [place] - [address]  Call pt @ [phone] Thank you!

## 2017-06-14 MED ORDER — PREGABALIN 50 MG PO CAPS: 50.0000 mg | ORAL_CAPSULE | Freq: Every day | ORAL | 0 refills | Status: DC

## 2017-06-14 MED FILL — LYRICA 50 MG CAPSULE: 50 | 90 days supply | Qty: 90 | Fill #0

## 2017-06-15 ENCOUNTER — Other Ambulatory Visit: Payer: Self-pay

## 2017-06-15 MED ORDER — PREGABALIN 50 MG PO CAPS
50.0000 mg | ORAL_CAPSULE | Freq: Every day | ORAL | 0 refills | Status: DC
Start: 2017-06-15 — End: 2017-06-25

## 2017-06-15 MED ORDER — BUPIVACAINE HCL 0.5 % IJ SOLN
3.0000 mL | INTRAMUSCULAR | Status: AC | PRN
  Administered 2017-06-11: 3 mL via INTRA_ARTICULAR

## 2017-06-15 MED ORDER — TRIAMCINOLONE ACETONIDE 40 MG/ML IJ SUSP
80.0000 mg | INTRAMUSCULAR | Status: AC | PRN
  Administered 2017-06-11: 80 mg via INTRA_ARTICULAR

## 2017-06-15 NOTE — Telephone Encounter (Signed)
Can you please fill this?

## 2017-06-15 NOTE — Telephone Encounter (Signed)
C, can you please print and I will sign?

## 2017-06-15 NOTE — Telephone Encounter (Signed)
This has been done.

## 2017-06-25 ENCOUNTER — Encounter: Payer: Self-pay | Admitting: Internal Medicine

## 2017-06-25 ENCOUNTER — Ambulatory Visit (INDEPENDENT_AMBULATORY_CARE_PROVIDER_SITE_OTHER): Payer: 59 | Admitting: Internal Medicine

## 2017-06-25 VITALS — BP 112/80 | HR 74 | Ht 69.0 in | Wt 249.0 lb

## 2017-06-25 DIAGNOSIS — E1042 Type 1 diabetes mellitus with diabetic polyneuropathy: Secondary | ICD-10-CM | POA: Diagnosis not present

## 2017-06-25 LAB — COMPLETE METABOLIC PANEL WITH GFR
AG Ratio: 1.7 (calc) (ref 1.0–2.5)
ALT: 17 U/L (ref 9–46)
AST: 13 U/L (ref 10–40)
Albumin: 4.1 g/dL (ref 3.6–5.1)
Alkaline phosphatase (APISO): 77 U/L (ref 40–115)
BUN: 21 mg/dL (ref 7–25)
CALCIUM: 9.2 mg/dL (ref 8.6–10.3)
CO2: 27 mmol/L (ref 20–32)
CREATININE: 1.03 mg/dL (ref 0.60–1.35)
Chloride: 107 mmol/L (ref 98–110)
GFR, EST NON AFRICAN AMERICAN: 89 mL/min/{1.73_m2} (ref >=60)
GFR, Est African American: 103 mL/min/{1.73_m2} (ref >=60)
GLOBULIN: 2.4 g/dL (ref 1.9–3.7)
Glucose, Bld: 160 mg/dL — ABNORMAL HIGH (ref 65–99)
Potassium: 4.7 mmol/L (ref 3.5–5.3)
SODIUM: 140 mmol/L (ref 135–146)
TOTAL PROTEIN: 6.5 g/dL (ref 6.1–8.1)
Total Bilirubin: 0.7 mg/dL (ref 0.2–1.2)

## 2017-06-25 LAB — LIPID PANEL
CHOL/HDL RATIO: 3
Cholesterol: 115 mg/dL (ref 0–200)
HDL: 43.9 mg/dL (ref >39.00)
LDL CALC: 60 mg/dL (ref 0–99)
NonHDL: 71.27
TRIGLYCERIDES: 54 mg/dL (ref 0.0–149.0)
VLDL: 10.8 mg/dL (ref 0.0–40.0)

## 2017-06-25 LAB — POCT GLYCOSYLATED HEMOGLOBIN (HGB A1C): Hemoglobin A1C: 8.5

## 2017-06-25 LAB — TSH: TSH: 3.55 u[IU]/mL (ref 0.35–4.50)

## 2017-06-25 LAB — MICROALBUMIN / CREATININE URINE RATIO
CREATININE, U: 155.4 mg/dL
Microalb Creat Ratio: 0.5 mg/g (ref 0.0–30.0)
Microalb, Ur: <0.7 mg/dL (ref 0.0–1.9)

## 2017-06-25 MED ORDER — PREGABALIN 50 MG PO CAPS: 50.0000 mg | ORAL_CAPSULE | Freq: Every day | ORAL | 3 refills | Status: DC

## 2017-06-25 NOTE — Patient Instructions (Addendum)
Please continue: - basal rates: 12 am: 1.55 >> 1.6 8 am: 1.4 >> 1.6 4 pm: 1.4 >> 1.6 9 pm: 1.3 >> 1.5 - ICR:  12 am: 5.5 >> 5 6 am: 5 >> 4.5 11:30 am: 5 >> 4.5 6 pm: 6.5 >> 5 - target: 110-120  - ISF:   12 am: 40 - Insulin on Board: 4h >> 3 h  Try to bolus 10-15 min before meals.  Please stop at the lab.  Please return in 3-4 months with your sugar log.

## 2017-06-25 NOTE — Progress Notes (Signed)
Patient ID: Kenneth [name], male   DOB: May 13, 1975, 42 y.o.   MRN: [id number]  HPI: Kenneth [name] is a 42 y.o.-year-old male, initially referred by his PCP, Kenneth [name], returning for f/u for DM1, dx 10 (age 42), uncontrolled, with complications (PN). He previously saw Kenneth [name] and Kenneth [name]. Last visit with me 3 months ago.  He had a steroid inj for frozen shoulder 2 weeks ago >> sugars 400s.   He started a new job 02/2017. Schedule is erratic, as are the mealtimes.  Last hemoglobin A1c was: Lab Results  Component Value Date   HGBA1C 7.9 03/23/2017   HGBA1C 7.2 06/23/2016   HGBA1C 8.2 03/13/2016   He is on insulin pump: - Medtronic 670G - since 01/2016 (but recently obtained a replacement pump), + CGM since 02/2016.  Insulin: Fiasp.  Supplies: [employer].  Pump settings: - basal rates: 12 am: 1.4 >> 1.55 8 am: 1.4 4 pm: 1.4 9 pm: 1.2 >> 1.3 - ICR:  12 am: 5.5 6 am: 5 11:30 am: 5 6 pm: 6.5 - target: 110-120  - ISF:   12 am: 40 - Insulin on Board: 4h 4h - bolus wizard: on TDD from basal insulin: 54% >> 52% >> 54% (53 units) TDD from bolus insulin: 46% >> 48% >> 46% (45 units) Total daily dose of 100 units.  - changes infusion site: q3 days - Meter: Bayer Contour  He was also on Metformin ER 500 mg bid >> nausea >> stopped.  Pt checks his sugars 3.3x a day (we will scan reports) CGM alarms are: 65-250 respectively.  CGM parameters: - Average from CGM:  186+/-53 >> 203+/-75 - Average from manual BG checks: 218+/-72 >> 249+/-104  Time in range:  - very low (40-50):  0% >> 0% - low (50-70):  0% >> 0% - normal range (70-180): 49% >> 43% - high sugars (180-250): 37% >> 33% - very high sugars (250-400): 14% >> 24%  - in auto mode: 86% >> 71% >> 0% >> 55% - in manual mode: 14% >> 29% >> 100% >> 35%  Lowest sugar was 39 (miscalculated carbs the night before) >> ... >> 81 >> 55 >> 85; he has hypoglycemia awareness in the 70s.  No previous hypoglycemia  admission.  He does have a non-expired glucagon kit at home. 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 Highest sugar was 300 >> 400 >> >400.  No previous DKA admission.  Pt's meals are: - Breakfast: snack cake, yoghurt - Lunch: sandwich, frozen foods - Dinner: pizza, tacos, steak, veggies, hot dogs - Snacks: chips, ice cream, yoghurt, fruit, 3-4x a day   -No CKD, last BUN/creatinine:  Lab Results  Component Value Date   BUN 15 07/28/2016   CREATININE 1.12 07/28/2016   He has a h/o albuminuria in the past, not recently: Lab Results  Component Value Date   MICRALBCREAT 0.9 03/13/2016  07/27/2014: ACR 3.2.  On lisinopril.  -+ HL; last set of lipids: Lab Results  Component Value Date   CHOL 115 03/13/2016   HDL 42.60 03/13/2016   LDLCALC 60 03/13/2016   TRIG 65.0 03/13/2016   CHOLHDL 3 03/13/2016  On Lipitor.  - last eye exam was in 05/2017: No DR. + bilateral cataracts  -+ Numbness and tingling in his feet.  Lyrica is helping.  Last TSH normal: Lab Results  Component Value Date   TSH 3.89 03/13/2016   ROS: Constitutional: no weight gain/no weight loss,  no fatigue, no subjective hyperthermia, no subjective hypothermia Eyes: no blurry vision, no xerophthalmia ENT: no sore throat, no nodules palpated in throat, no dysphagia, no odynophagia, no hoarseness Cardiovascular: no CP/no SOB/no palpitations/no leg swelling Respiratory: no cough/no SOB/no wheezing Gastrointestinal: no N/no V/no D/no C/no acid reflux Musculoskeletal: no muscle aches/no joint aches Skin: no rashes, no hair loss Neurological: no tremors/+ numbness/+ tingling/no dizziness, + HAs  I reviewed pt's medications, allergies, PMH, social hx, family hx, and changes were documented in the history of present illness. Otherwise, unchanged from my initial visit note.  Past Medical History:  Diagnosis Date  . Classic migraine 08/05/2016  . Depression, major, single episode   .  Diabetes mellitus without complication ([place])   . H/O cardiac catheterization    findings were normal  . Hypertension   . Injury of hip, left September 2015  . Sleep apnea    Past Surgical History:  Procedure Laterality Date  . CORONARY ARTERY BYPASS GRAFT  Around 2011 or 2012  . VASECTOMY    . WISDOM TOOTH EXTRACTION     x8   Social History   Social History  . Marital Status: Married    Spouse Name: N/A  . Number of Children: 3   Occupational History  . student   Social History Main Topics  . Smoking status: Never Smoker   . Smokeless tobacco: Never Used  . Alcohol Use: No  . Drug Use: No   Current Outpatient Medications on File Prior to Visit  Medication Sig Dispense Refill  . aspirin 81 MG EC tablet Take by mouth.    Marland Kitchen atorvastatin (LIPITOR) 20 MG tablet   2  . cetirizine (ZYRTEC) 10 MG tablet Take 10 mg by mouth daily.    . cholecalciferol (VITAMIN D) 1000 units tablet Take 2,000 Units by mouth daily.    Marland Kitchen glucagon (GLUCAGON EMERGENCY) 1 MG injection Inject 1 mg into the muscle once as needed. 1 each prn  . glucose blood (BAYER CONTOUR NEXT TEST) test strip USE AS INSTRUCTED to text 7 times daily 700 each 3  . insulin lispro (HUMALOG) 100 UNIT/ML injection Use in insulin pump - up to 100 units a day 90 mL 3  . lisinopril (PRINIVIL,ZESTRIL) 5 MG tablet Take by mouth.    . pantoprazole (PROTONIX) 40 MG tablet Take 40 mg by mouth daily.    . pregabalin (LYRICA) 50 MG capsule Take 1 capsule (50 mg total) by mouth daily. 90 capsule 0  . topiramate (TOPAMAX) 50 MG tablet Take 1 tablet (50 mg total) by mouth at bedtime. 90 tablet 3   No current facility-administered medications on file prior to visit.    Allergies  Allergen Reactions  . Dilantin [Phenytoin] Other (See Comments)    Hallucinations  . Gabapentin Other (See Comments)    Causes hallucinations   Family History  Problem Relation Age of Onset  . Arthritis Mother   . Leukemia Mother   . Diabetes Father    . Heart disease Father   . Cancer Father   . Migraines Father    PE: Ht 5' 9"  (1.753 m)   Wt 249 lb (112.9 kg)   BMI 36.77 kg/m  Wt Readings from Last 3 Encounters:  06/25/17 249 lb (112.9 kg)  06/10/17 252 lb 3.2 oz (114.4 kg)  03/23/17 251 lb (113.9 kg)   Constitutional: overweight, in NAD Eyes: PERRLA, EOMI, no exophthalmos ENT: moist mucous membranes, no thyromegaly, no cervical lymphadenopathy Cardiovascular: RRR, No MRG Respiratory:  CTA B Gastrointestinal: abdomen soft, NT, ND, BS+ Musculoskeletal: no deformities, strength intact in all 4 Skin: moist, warm, no rashes Neurological: no tremor with outstretched hands, DTR normal in all 4   ASSESSMENT: 1. DM1, uncontrolled, with complications - PN  50/27/7412: C-peptide<0.1 (1.1-4.4 ng/mL), Glucose 176   PLAN:  1. Patient with long-standing, uncontrolled, type 1 diabetes, on Medtronic 670 G insulin pump with integrated CGM.  At last visit, sugars were higher than before and he complained about being kicked out of the automatic mode more.  We discussed about calling Medtronic to see if his sensors are among the ones that were recalled.  He checked with Medtronic and this was not the case for his sensors. - At last visit, he had increased sugars during the night and also in the morning.  We increased his basal rates during the night for the time when he is not in the automatic mode - at this visit, his sugars were much higher after he recent shoulder steroid injection, and he did not increase his mealtime boluses.  Instead, he was trying to correct his postprandial hyperglycemia.  This did not work very well.  In the last few days, sugars have improved some.  We discussed that if he has steroids again, he will need to decrease his insulin to carb ratios. - Especially during his steroid exposure, he was kicked out of the automatic mode more than before, so he had to rely on his manual pump settings.  At this visit, since his  sugars continue to be high especially after meals even if he is bolusing 15 minutes before the meals, will decrease his insulin to carb ratios with all of the meals.  We will also increase his basal rates, since he is staying quite hyperglycemic throughout the day, and especially during the night  - we discussed that for this pump, even Fiasp needs to be injected at least 10 minutes before he starts eating - He is doing a great job changing his infusion site every 3 days as advised - I suggested to:  Patient Instructions  Please continue: - basal rates: 12 am: 1.55 >> 1.6 8 am: 1.4 >> 1.6 4 pm: 1.4 >> 1.6 9 pm: 1.3 >> 1.5 - ICR:  12 am: 5.5 >> 5 6 am: 5 >> 4.5 11:30 am: 5 >> 4.5 6 pm: 6.5 >> 5 - target: 110-120  - ISF:   12 am: 40 - Insulin on Board: 4h >> 3 h  Try to bolus 10-15 min before meals.  Please stop at the lab.  Please return in 3-4 months with your sugar log.    - today, HbA1c is 8.5% (higher) - continue checking sugars at different times of the day - check 4x a day, rotating checks - advised for yearly eye exams >> he is not UTD - will check annual labs today - Return to clinic in 3 mo with sugar log    - time spent with the patient: 30 min, of which >50% was spent in reviewing his pump and CGM downloads, discussing his hyper-glycemic episodes, reviewing previous labs and pump settings and developing a plan to avoid hypo- and hyper-glycemia.   Component     Latest Ref Rng & Units 06/25/2017  Glucose     65 - 99 mg/dL 160 (H)  BUN     7 - 25 mg/dL 21  Creatinine     0.60 - 1.35 mg/dL 1.03  GFR, Est Non African American     >  OR = 60 mL/min/1.34m 89  GFR, Est African American     > OR = 60 mL/min/1.762m103  BUN/Creatinine Ratio     6 - 22 (calc) NOT APPLICABLE  Sodium     13986 146 mmol/L 140  Potassium     3.5 - 5.3 mmol/L 4.7  Chloride     98 - 110 mmol/L 107  CO2     20 - 32 mmol/L 27  Calcium     8.6 - 10.3 mg/dL 9.2  Total Protein     6.1  - 8.1 g/dL 6.5  Albumin MSPROF     3.6 - 5.1 g/dL 4.1  Globulin     1.9 - 3.7 g/dL (calc) 2.4  AG Ratio     1.0 - 2.5 (calc) 1.7  Total Bilirubin     0.2 - 1.2 mg/dL 0.7  Alkaline phosphatase (APISO)     40 - 115 U/L 77  AST     10 - 40 U/L 13  ALT     9 - 46 U/L 17  Cholesterol     0 - 200 mg/dL 115  Triglycerides     0.0 - 149.0 mg/dL 54.0  HDL Cholesterol     >39.00 mg/dL 43.90  VLDL     0.0 - 40.0 mg/dL 10.8  LDL (calc)     0 - 99 mg/dL 60  Total CHOL/HDL Ratio      3  NonHDL      71.27  Microalb, Ur     0.0 - 1.9 mg/dL <0.7  Creatinine,U     mg/dL 155.4  MICROALB/CREAT RATIO     0.0 - 30.0 mg/g 0.5  Hemoglobin A1C      8.5  TSH     0.35 - 4.50 uIU/mL 3.55   Labs are normal except high glucose.  CrPhilemon KingdomMD PhD [facility]

## 2017-07-06 DIAGNOSIS — E108 Type 1 diabetes mellitus with unspecified complications: Secondary | ICD-10-CM | POA: Diagnosis not present

## 2017-07-06 MED FILL — ATORVASTATIN 20 MG TABLET: 20 | 90 days supply | Qty: 90 | Fill #3

## 2017-07-09 DIAGNOSIS — G4733 Obstructive sleep apnea (adult) (pediatric): Secondary | ICD-10-CM | POA: Diagnosis not present

## 2017-07-21 MED FILL — HumaLOG 100 UNIT/ML SOLN: 100 | 90 days supply | Qty: 90 | Fill #2

## 2017-08-02 DIAGNOSIS — E108 Type 1 diabetes mellitus with unspecified complications: Secondary | ICD-10-CM | POA: Diagnosis not present

## 2017-08-05 MED FILL — PANTOPRAZOLE SOD DR 40 MG T: 40 | 90 days supply | Qty: 90 | Fill #0

## 2017-08-06 DIAGNOSIS — Z0000 Encounter for general adult medical examination without abnormal findings: Secondary | ICD-10-CM | POA: Diagnosis not present

## 2017-08-06 DIAGNOSIS — E7800 Pure hypercholesterolemia, unspecified: Secondary | ICD-10-CM | POA: Diagnosis not present

## 2017-08-09 DIAGNOSIS — G4733 Obstructive sleep apnea (adult) (pediatric): Secondary | ICD-10-CM | POA: Diagnosis not present

## 2017-08-20 MED FILL — LISINOPRIL 5 MG TABLET: 5 | 90 days supply | Qty: 90 | Fill #0

## 2017-09-02 DIAGNOSIS — J029 Acute pharyngitis, unspecified: Secondary | ICD-10-CM | POA: Diagnosis not present

## 2017-09-02 DIAGNOSIS — J069 Acute upper respiratory infection, unspecified: Secondary | ICD-10-CM | POA: Diagnosis not present

## 2017-09-08 DIAGNOSIS — G4733 Obstructive sleep apnea (adult) (pediatric): Secondary | ICD-10-CM | POA: Diagnosis not present

## 2017-09-13 ENCOUNTER — Other Ambulatory Visit: Payer: Self-pay | Admitting: Internal Medicine

## 2017-09-13 MED FILL — PREGABALIN 50 MG CAPS: 50 | 90 days supply | Qty: 90 | Fill #0

## 2017-09-13 MED FILL — TOPIRAMATE 50 MG TABLET: 50 | 90 days supply | Qty: 90 | Fill #1

## 2017-09-13 NOTE — Telephone Encounter (Signed)
Is this okay to refill? 

## 2017-09-13 NOTE — Telephone Encounter (Signed)
Yes, ok 

## 2017-09-30 MED FILL — ATORVASTATIN CALCIUM 20 MG: 20 | 90 days supply | Qty: 90 | Fill #0

## 2017-10-04 ENCOUNTER — Other Ambulatory Visit: Payer: Self-pay | Admitting: Internal Medicine

## 2017-10-04 DIAGNOSIS — K625 Hemorrhage of anus and rectum: Secondary | ICD-10-CM | POA: Diagnosis not present

## 2017-10-04 DIAGNOSIS — Z800 Family history of malignant neoplasm of digestive organs: Secondary | ICD-10-CM | POA: Diagnosis not present

## 2017-10-04 MED FILL — CONTOUR NEXT STRIPS: 85 days supply | Qty: 600 | Fill #0

## 2017-10-08 DIAGNOSIS — G4733 Obstructive sleep apnea (adult) (pediatric): Secondary | ICD-10-CM | POA: Diagnosis not present

## 2017-10-12 MED FILL — HumaLOG 100 UNIT/ML SOLN: 100 | 90 days supply | Qty: 90 | Fill #3

## 2017-10-14 DIAGNOSIS — E108 Type 1 diabetes mellitus with unspecified complications: Secondary | ICD-10-CM | POA: Diagnosis not present

## 2017-10-22 DIAGNOSIS — E108 Type 1 diabetes mellitus with unspecified complications: Secondary | ICD-10-CM | POA: Diagnosis not present

## 2017-10-29 ENCOUNTER — Encounter: Payer: Self-pay | Admitting: Internal Medicine

## 2017-10-29 ENCOUNTER — Ambulatory Visit (INDEPENDENT_AMBULATORY_CARE_PROVIDER_SITE_OTHER): Payer: 59 | Admitting: Internal Medicine

## 2017-10-29 VITALS — BP 114/70 | HR 68 | Ht 69.0 in | Wt 255.4 lb

## 2017-10-29 DIAGNOSIS — E1042 Type 1 diabetes mellitus with diabetic polyneuropathy: Secondary | ICD-10-CM

## 2017-10-29 DIAGNOSIS — Z23 Encounter for immunization: Secondary | ICD-10-CM | POA: Diagnosis not present

## 2017-10-29 LAB — POCT GLYCOSYLATED HEMOGLOBIN (HGB A1C): HEMOGLOBIN A1C: 7.8 % — AB (ref 4.0–5.6)

## 2017-10-29 MED FILL — PEG-3350 SOLUTION: 420 | 1 days supply | Qty: 4000 | Fill #0

## 2017-10-29 NOTE — Progress Notes (Signed)
Patient ID: Kenneth [name], male   DOB: 07-28-1975, 42 y.o.   MRN: [id number]  HPI: Kenneth [name] is a 42 y.o.-year-old male, initially referred by his PCP, Dr. Radene Ou, returning for f/u for DM1, dx 72 (age 42), uncontrolled, with complications (PN). He previously saw Dr. Meredith Pel and Dr. Hartford Poli. Last visit with me 4 months ago.  He started a new job 02/2017.  Schedule is erratic, as are his mealtimes.  He is having a lot of stress >> eating more >> he feels that he is sugars higher.  Last hemoglobin A1c was: Lab Results  Component Value Date   HGBA1C 8.5 06/25/2017   HGBA1C 7.9 03/23/2017   HGBA1C 7.2 06/23/2016   He continues on an insulin pump: Medtronic 670 G since 01/2016, + guardian CGM since 02/2016 Insulin: Fiasp >> Humalog.  Supplies: [employer]  Pump settings: - basal rates: 12 am: 1.55 >> 1.6 8 am: 1.4 >> 1.6 4 pm: 1.4 >> 1.6 9 pm: 1.3 >> 1.5 - ICR:  12 am: 5.5 >> 5 6 am: 5 >> 4.5 11:30 am: 5 >> 4.5 6 pm: 6.5 >> 5 - target: 110-120  - ISF:   12 am: 40 - Insulin on Board: 4h >> 3 h TDD from basal insulin: 54% (53 units) >> 53% TDD from bolus insulin: 46% (45 units) >> 47% Total daily dose of 102 units.  - changes infusion site: q3 days - Meter: Bayer Contour  We tried metformin ER 500 mg twice daily, but he developed nausea and had to stop.  Pt checks his sugars 3-4 x a day (we will scan the reports).  CGM alarms are: 65-250  CGM parameters: - Average from CGM:  186+/-53 >> 203+/-75 >> 178+/-56 - Average from manual BG checks: 218+/-72 >> 249+/-104 >> 193+/-72  Time in range:  - very low (40-50):  0% >> 0% >> 0% - low (50-70):  0% >> 0% >> 1% - normal range (70-180): 49% >> 43% >> 52% - high sugars (180-250): 37% >> 33% >> 35% - very high sugars (250-400): 14% >> 24% >> 12%  - in auto mode: 86% >> 71% >> 0% >> 55% >> 72% - in manual mode: 14% >> 29% >> 100% >> 35% >> 20%  Lowest sugar was 39 (miscalculated carbs the night before) >> ... 85 >>  45 last night (on insulin pen), occasional 60s.; he has hypoglycemia awareness in the 70s.  No previous hypoglycemia admission.  He does have a non-expired glucagon kit at home. 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  Highest sugar was >400 >> 400 x1 (ABx); no previous DKA admission.  Pt's meals are: - Breakfast: snack cake, yoghurt - Lunch: sandwich, frozen foods - Dinner: pizza, tacos, steak, veggies, hot dogs - Snacks: chips, ice cream, yoghurt, fruit, 3-4x a day   -No CKD, last BUN/creatinine:  Lab Results  Component Value Date   BUN 21 06/25/2017   CREATININE 1.03 06/25/2017   He has a history of microalbuminuria, but normal ACR's lately: Lab Results  Component Value Date   MICRALBCREAT 0.5 06/25/2017   MICRALBCREAT 0.9 03/13/2016  07/27/2014: ACR 3.2.  On lisinopril.  -+ HL; last set of lipids: Lab Results  Component Value Date   CHOL 115 06/25/2017   HDL 43.90 06/25/2017   LDLCALC 60 06/25/2017   TRIG 54.0 06/25/2017   CHOLHDL 3 06/25/2017  On Lipitor.  - last eye exam was in 05/2017: No DR.  +  Bilateral cataracts  -+ Numbness and tingling in his feet.  Lyrica is helping.  Last TSH normal: Lab Results  Component Value Date   TSH 3.55 06/25/2017   ROS: Constitutional: no weight gain/no weight loss, no fatigue, no subjective hyperthermia, no subjective hypothermia Eyes: + blurry vision, no xerophthalmia ENT: no sore throat, no nodules palpated in throat, no dysphagia, no odynophagia, no hoarseness Cardiovascular: no CP/no SOB/no palpitations/no leg swelling Respiratory: no cough/no SOB/no wheezing Gastrointestinal: no N/no V/no D/no C/+ acid reflux Musculoskeletal: no muscle aches/no joint aches Skin: no rashes, no hair loss Neurological: no tremors/+ numbness/+ tingling/no dizziness, + HA  I reviewed pt's medications, allergies, PMH, social hx, family hx, and changes were documented in the history of present illness. Otherwise,  unchanged from my initial visit note.   Past Medical History:  Diagnosis Date  . Classic migraine 08/05/2016  . Depression, major, single episode   . Diabetes mellitus without complication ([place])   . H/O cardiac catheterization    findings were normal  . Hypertension   . Injury of hip, left September 2015  . Sleep apnea    Past Surgical History:  Procedure Laterality Date  . CORONARY ARTERY BYPASS GRAFT  Around 2011 or 2012  . VASECTOMY    . WISDOM TOOTH EXTRACTION     x8   Social History   Social History  . Marital Status: Married    Spouse Name: N/A  . Number of Children: 3   Occupational History  . student   Social History Main Topics  . Smoking status: Never Smoker   . Smokeless tobacco: Never Used  . Alcohol Use: No  . Drug Use: No   Current Outpatient Medications on File Prior to Visit  Medication Sig Dispense Refill  . aspirin 81 MG EC tablet Take by mouth.    Marland Kitchen atorvastatin (LIPITOR) 20 MG tablet   2  . cetirizine (ZYRTEC) 10 MG tablet Take 10 mg by mouth daily.    . cholecalciferol (VITAMIN D) 1000 units tablet Take 2,000 Units by mouth daily.    . CONTOUR NEXT TEST test strip USE AS INSTRUCTED TO TEST 7 TIMES DAILY 700 each 3  . glucagon (GLUCAGON EMERGENCY) 1 MG injection Inject 1 mg into the muscle once as needed. 1 each prn  . insulin lispro (HUMALOG) 100 UNIT/ML injection Use in insulin pump - up to 100 units a day 90 mL 3  . lisinopril (PRINIVIL,ZESTRIL) 5 MG tablet Take by mouth.    Marland Kitchen LYRICA 50 MG capsule TAKE 1 CAPSULE BY MOUTH DAILY 90 capsule 0  . pantoprazole (PROTONIX) 40 MG tablet Take 40 mg by mouth daily.    Marland Kitchen topiramate (TOPAMAX) 50 MG tablet Take 1 tablet (50 mg total) by mouth at bedtime. 90 tablet 3   No current facility-administered medications on file prior to visit.    Allergies  Allergen Reactions  . Dilantin [Phenytoin] Other (See Comments)    Hallucinations  . Gabapentin Other (See Comments)    Causes hallucinations   Family  History  Problem Relation Age of Onset  . Arthritis Mother   . Leukemia Mother   . Diabetes Father   . Heart disease Father   . Cancer Father   . Migraines Father    PE: BP 114/70   Pulse 68   Ht 5' 9"  (1.753 m)   Wt 255 lb 6.4 oz (115.8 kg)   SpO2 98%   BMI 37.72 kg/m  Wt Readings from Last 3  Encounters:  10/29/17 255 lb 6.4 oz (115.8 kg)  06/25/17 249 lb (112.9 kg)  06/10/17 252 lb 3.2 oz (114.4 kg)   Constitutional: overweight, in NAD Eyes: PERRLA, EOMI, no exophthalmos ENT: moist mucous membranes, no thyromegaly, no cervical lymphadenopathy Cardiovascular: RRR, No MRG Respiratory: CTA B Gastrointestinal: abdomen soft, NT, ND, BS+ Musculoskeletal: no deformities, strength intact in all 4 Skin: moist, warm, no rashes Neurological: no tremor with outstretched hands, DTR normal in all 4   ASSESSMENT: 1. DM1, uncontrolled, with complications - PN  74/94/4967: C-peptide<0.1 (1.1-4.4 ng/mL), Glucose 176   2. HL  PLAN:  1. Patient with long-standing, uncontrolled, type 1 diabetes, on Medtronic 670 G insulin pump with integrated CGM.  At last visit, he had increased sugars after a shoulder steroid injection and he did not increase his mealtime boluses but we will try to correct his postprandial hyperglycemia.  This did not work very well for him.  We discussed that if he gets another steroid injection, he needs to decrease his insulin to carb ratios.  At that time, because of the very high blood sugars, he was being kicked out of the automatic mode more, so he had to rely on his manual pump settings.  We discussed about the importance of bolusing 10-15 minutes before a meal, but we also changed his settings: We increased his basal rates and decrease his ICR's.  I also advised him to decrease his active insulin time from 4 hours to 3 hours.  - We reviewed his labs from last visit and they were all normal with the exception of a high glucose - At this visit, sugars actually  appear improved, despite a period of stress due to his mother being sick.  His sugars appear to increase after meals even though he is trying to bolus 10 to 15 minutes before every meal.  In this case, we discussed about the need to decrease his insulin to carb ratios, but I do not feel other changes are necessary for now. - I suggested to:  Patient Instructions  Please continue: - basal rates: 12 am: 1.6 8 am: 1.6 4 pm: 1.6 9 pm: 1.5 - ICR:  12 am: 5 >> 4 6 am: 4.5 >> 3.5 11:30 am: 4.5 >> 3.5 6 pm: 5 >> 4 - target: 110-120  - ISF:   12 am: 40 - Insulin on Board: 3h  Please return in 3-4 months with your sugar log.   - today, HbA1c is 7.8% (better) - continue checking sugars at different times of the day - check >4x a day, rotating checks - advised for yearly eye exams >> he is UTD - flu shot today - Return to clinic in 3-4 mo with sugar log   2. HL - Reviewed latest lipid panel from 06/2017: All fractions at goal Lab Results  Component Value Date   CHOL 115 06/25/2017   HDL 43.90 06/25/2017   LDLCALC 60 06/25/2017   TRIG 54.0 06/25/2017   CHOLHDL 3 06/25/2017  - Continues Lipitor without side effects.    Philemon Kingdom, MD PhD [facility]

## 2017-10-29 NOTE — Addendum Note (Signed)
Addended by: Yolande JollyLAWSON, Marabelle Cushman on: 10/29/2017 04:18 PM   Modules accepted: Orders

## 2017-10-29 NOTE — Patient Instructions (Addendum)
Please continue: - basal rates: 12 am: 1.6 8 am: 1.6 4 pm: 1.6 9 pm: 1.5 - ICR:  12 am: 5 >> 4 6 am: 4.5 >> 3.5 11:30 am: 4.5 >> 3.5 6 pm: 5 >> 4 - target: 110-120  - ISF:   12 am: 40 - Insulin on Board: 3h  Please return in 3-4 months with your sugar log.

## 2017-11-05 DIAGNOSIS — K648 Other hemorrhoids: Secondary | ICD-10-CM | POA: Diagnosis not present

## 2017-11-05 DIAGNOSIS — K635 Polyp of colon: Secondary | ICD-10-CM | POA: Diagnosis not present

## 2017-11-05 DIAGNOSIS — K625 Hemorrhage of anus and rectum: Secondary | ICD-10-CM | POA: Diagnosis not present

## 2017-11-08 DIAGNOSIS — G4733 Obstructive sleep apnea (adult) (pediatric): Secondary | ICD-10-CM | POA: Diagnosis not present

## 2017-11-12 MED FILL — PANTOPRAZOLE SOD DR 40 MG T: 40 | 90 days supply | Qty: 90 | Fill #0

## 2017-11-30 MED FILL — LISINOPRIL 5 MG TABLET: 5 | 90 days supply | Qty: 90 | Fill #1

## 2017-12-08 DIAGNOSIS — G4733 Obstructive sleep apnea (adult) (pediatric): Secondary | ICD-10-CM | POA: Diagnosis not present

## 2017-12-20 ENCOUNTER — Other Ambulatory Visit: Payer: Self-pay | Admitting: Internal Medicine

## 2017-12-20 MED FILL — TOPIRAMATE 50 MG TABLET: 50 | 90 days supply | Qty: 90 | Fill #2

## 2017-12-20 MED FILL — PREGABALIN 50 MG CAPS: 50 | 90 days supply | Qty: 90 | Fill #0

## 2017-12-20 NOTE — Telephone Encounter (Signed)
Please advise if you fill this med.

## 2017-12-20 NOTE — Telephone Encounter (Signed)
OK 

## 2018-01-04 DIAGNOSIS — E108 Type 1 diabetes mellitus with unspecified complications: Secondary | ICD-10-CM | POA: Diagnosis not present

## 2018-01-05 ENCOUNTER — Other Ambulatory Visit: Payer: Self-pay | Admitting: Internal Medicine

## 2018-01-05 MED FILL — HumaLOG 100 UNIT/ML SOLN: 100 | 90 days supply | Qty: 90 | Fill #0

## 2018-01-05 MED FILL — ATORVASTATIN CALCIUM 20 MG: 20 | 90 days supply | Qty: 90 | Fill #0

## 2018-01-07 DIAGNOSIS — G4733 Obstructive sleep apnea (adult) (pediatric): Secondary | ICD-10-CM | POA: Diagnosis not present

## 2018-01-11 DIAGNOSIS — E108 Type 1 diabetes mellitus with unspecified complications: Secondary | ICD-10-CM | POA: Diagnosis not present

## 2018-02-06 DIAGNOSIS — G4733 Obstructive sleep apnea (adult) (pediatric): Secondary | ICD-10-CM | POA: Diagnosis not present

## 2018-02-07 MED FILL — CONTOUR NEXT STRIPS: 85 days supply | Qty: 600 | Fill #1

## 2018-02-07 MED FILL — PANTOPRAZOLE SOD DR 40 MG T: 40 | 90 days supply | Qty: 90 | Fill #1

## 2018-03-04 ENCOUNTER — Encounter: Payer: Self-pay | Admitting: Internal Medicine

## 2018-03-04 ENCOUNTER — Ambulatory Visit: Payer: 59 | Admitting: Internal Medicine

## 2018-03-04 VITALS — BP 120/88 | HR 86 | Ht 69.0 in | Wt 262.0 lb

## 2018-03-04 DIAGNOSIS — E785 Hyperlipidemia, unspecified: Secondary | ICD-10-CM

## 2018-03-04 DIAGNOSIS — Z6838 Body mass index (BMI) 38.0-38.9, adult: Secondary | ICD-10-CM

## 2018-03-04 DIAGNOSIS — E1042 Type 1 diabetes mellitus with diabetic polyneuropathy: Secondary | ICD-10-CM | POA: Diagnosis not present

## 2018-03-04 LAB — POCT GLYCOSYLATED HEMOGLOBIN (HGB A1C): HEMOGLOBIN A1C: 7.7 % — AB (ref 4.0–5.6)

## 2018-03-04 NOTE — Patient Instructions (Addendum)
Please continue: - basal rates: 12 am: 1.6 8 am: 1.6 4 pm: 1.6 9 pm: 1.5 - ICR:  12 am: 4 6 am: 3.5 11:30 am: 3.5 6 pm: 4 - target: 110-120  - ISF:   12 am: 40 - Insulin on Board: 3h  Can try the following combination for neuropathy: - alpha-lipoic acid 600 mg twice a day - B complex 1 tablet once a day  Please return in 3-4 months with your sugar log.

## 2018-03-04 NOTE — Progress Notes (Signed)
Patient ID: Kenneth [name], male   DOB: 1975/12/25, 43 y.o.   MRN: [id number]  HPI: Kenneth [name] is a 42 y.o.-year-old male, initially referred by his PCP, Dr. Radene Ou, returning for f/u for DM1, dx 39 (age 52), uncontrolled, with complications (PN). He previously saw Dr. Meredith Pel and Dr. Hartford Poli. Last visit with me 4 months ago.  His mother died the day after Christmas >> he is stressed.   He started a new job in 02/2017 >>  Schedule erratic as are his mealtimes.  Last hemoglobin A1c was: Lab Results  Component Value Date   HGBA1C 7.8 (A) 10/29/2017   HGBA1C 8.5 06/25/2017   HGBA1C 7.9 03/23/2017   He continues on an insulin pump: Medtronic 670 G since 01/2016, + guardian CGM since 02/2016 Insulin: Fiasp >> Humalog.  Supplies: [employer]  Pump settings: - basal rates: 12 am: 1.6 8 am: 1.6 4 pm: 1.6 9 pm: 1.5 - ICR:  12 am: 5 >> 4 6 am: 4.5 >> 3.5 11:30 am: 4.5 >> 3.5 6 pm: 5 >> 4 - target: 110-120  - ISF:   12 am: 40 - Insulin on Board: 3h TDD from basal insulin: 54% (53 units) >> 53% >> 54% TDD from bolus insulin: 46% (45 units) >> 47% >> 46% Total daily dose up to 120 units.  - changes infusion site: q4 days - Meter: Bayer Contour  We tried metformin ER 500 mg twice daily >> nausea >> stopped.  Pt checks his sugars 3-4 x a day (we will scan the reports).  CGM alarms are: 65-250  CGM parameters: - Average from CGM:  186+/-53 >> 203+/-75 >> 178+/-56 >> 182 +/- 58 - Average from manual BG checks: 218+/-72 >> 249+/-104 >> 193+/-72 >> 219 +/-82  Time in range:  - very low (40-50):  0% >> 0% >> 0% >> 0% - low (50-70):  0% >> 0% >> 1% >> 0% - normal range (70-180): 49% >> 43% >> 52% >> 53% - high sugars (180-250): 37% >> 33% >> 35% >> 32% - very high sugars (250-400): 14% >> 24% >> 12% >> 15%  - in auto mode: 86% >> 71% >> 0% >> 55% >> 72% >> 73%  - in manual mode: 14% >> 29% >> 100% >> 35% >> 20% >> 27%  Lowest sugar was 39 (miscalculated carbs the night  before) >> ... 85 >> 45 (on insulin pen) >> 50s;  He has hypoglycemia awareness in the 70s.  No previous hypoglycemia admissions.  He has a glucagon kit at home. 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  Highest sugar was >400 >> 400 x1 (ABx) >>300s; no previous DKA admissions.  Pt's meals are: - Breakfast: snack cake, yoghurt - Lunch: sandwich, frozen foods - Dinner: pizza, tacos, steak, veggies, hot dogs - Snacks: chips, ice cream, yoghurt, fruit, 3-4x a day   -No CKD, last BUN/creatinine:  Lab Results  Component Value Date   BUN 21 06/25/2017   CREATININE 1.03 06/25/2017   He has a history of microalbuminuria, but normal ACR's lately. Lab Results  Component Value Date   MICRALBCREAT 0.5 06/25/2017   MICRALBCREAT 0.9 03/13/2016  07/27/2014: ACR 3.2.  On lisinopril.  -+ HL; last set of lipids: Lab Results  Component Value Date   CHOL 115 06/25/2017   HDL 43.90 06/25/2017   LDLCALC 60 06/25/2017   TRIG 54.0 06/25/2017   CHOLHDL 3 06/25/2017  On Lipitor.  - last eye exam  was in 05/2017: No DR, + bilateral cataracts  -+ Numbness and tingling in his feet.  On Lyrica, which is helping  Last TSH normal: Lab Results  Component Value Date   TSH 3.55 06/25/2017   ROS: Constitutional: no weight gain/no weight loss, + fatigue, no subjective hyperthermia, no subjective hypothermia Eyes: no blurry vision, no xerophthalmia ENT: no sore throat, no nodules palpated in neck, no dysphagia, no odynophagia, no hoarseness Cardiovascular: no CP/no SOB/no palpitations/no leg swelling Respiratory: no cough/no SOB/no wheezing Gastrointestinal: no N/no V/no D/no C/+ acid reflux Musculoskeletal: + muscle aches/+ joint aches Skin: no rashes, no hair loss Neurological: no tremors/+ numbness/+ tingling/no dizziness, + HA  I reviewed pt's medications, allergies, PMH, social hx, family hx, and changes were documented in the history of present illness. Otherwise,  unchanged from my initial visit note.   Past Medical History:  Diagnosis Date  . Classic migraine 08/05/2016  . Depression, major, single episode   . Diabetes mellitus without complication ([place])   . H/O cardiac catheterization    findings were normal  . Hypertension   . Injury of hip, left September 2015  . Sleep apnea    Past Surgical History:  Procedure Laterality Date  . CORONARY ARTERY BYPASS GRAFT  Around 2011 or 2012  . VASECTOMY    . WISDOM TOOTH EXTRACTION     x8   Social History   Social History  . Marital Status: Married    Spouse Name: N/A  . Number of Children: 3   Occupational History  . student   Social History Main Topics  . Smoking status: Never Smoker   . Smokeless tobacco: Never Used  . Alcohol Use: No  . Drug Use: No   Current Outpatient Medications on File Prior to Visit  Medication Sig Dispense Refill  . aspirin 81 MG EC tablet Take by mouth.    Marland Kitchen atorvastatin (LIPITOR) 20 MG tablet   2  . cetirizine (ZYRTEC) 10 MG tablet Take 10 mg by mouth daily.    . cholecalciferol (VITAMIN D) 1000 units tablet Take 2,000 Units by mouth daily.    . CONTOUR NEXT TEST test strip USE AS INSTRUCTED TO TEST 7 TIMES DAILY 700 each 3  . glucagon (GLUCAGON EMERGENCY) 1 MG injection Inject 1 mg into the muscle once as needed. 1 each prn  . insulin lispro (HUMALOG) 100 UNIT/ML injection USE IN INSULIN PUMP - UP TO 100 UNITS A DAY 90 mL 3  . lisinopril (PRINIVIL,ZESTRIL) 5 MG tablet Take by mouth.    . pantoprazole (PROTONIX) 40 MG tablet Take 40 mg by mouth daily.    . pregabalin (LYRICA) 50 MG capsule TAKE 1 CAPSULE BY MOUTH ONCE DAILY 90 capsule 0  . topiramate (TOPAMAX) 50 MG tablet Take 1 tablet (50 mg total) by mouth at bedtime. 90 tablet 3   No current facility-administered medications on file prior to visit.    Allergies  Allergen Reactions  . Dilantin [Phenytoin] Other (See Comments)    Hallucinations  . Gabapentin Other (See Comments)    Causes  hallucinations   Family History  Problem Relation Age of Onset  . Arthritis Mother   . Leukemia Mother   . Diabetes Father   . Heart disease Father   . Cancer Father   . Migraines Father    PE: BP 120/88   Pulse 86   Ht 5' 9"  (1.753 m)   Wt 262 lb (118.8 kg)   SpO2 97%   BMI  38.69 kg/m  Wt Readings from Last 3 Encounters:  03/04/18 262 lb (118.8 kg)  10/29/17 255 lb 6.4 oz (115.8 kg)  06/25/17 249 lb (112.9 kg)   Constitutional: overweight, in NAD Eyes: PERRLA, EOMI, no exophthalmos ENT: moist mucous membranes, no thyromegaly, no cervical lymphadenopathy Cardiovascular: RRR, No MRG Respiratory: CTA B Gastrointestinal: abdomen soft, NT, ND, BS+ Musculoskeletal: no deformities, strength intact in all 4 Skin: moist, warm, no rashes Neurological: no tremor with outstretched hands, DTR normal in all 4  ASSESSMENT: 1. DM1, uncontrolled, with complications - PN  89/16/9450: C-peptide<0.1 (1.1-4.4 ng/mL), Glucose 176   2. HL  3. Obesity  4.  Peripheral neuropathy  PLAN:  1. Patient with longstanding, uncontrolled, type 1 diabetes, on Medtronic 670 G insulin pump with integrated CGM.  At last visit, sugars appear improved, despite a period of stress due to his mother being sick.  His sugars were still increasing after meals even though he was trying to bolus 10 to 15 minutes before the meal so we lowered his insulin to carb ratios then. His sugars improved after this, but he had a lot of stress since last visit with his mom being sick, being admitted for 1.5 months and then dying right after Christmas. -He is occasionally kicked out of the automatic mode either because of high insulin delivery but also because of low insulin delivery.  Also occasionally he is prompted to change the sensor and this is either in the middle of the night or at work and he cannot do this.  We discussed about scheduling the sensor changes so that he does this proactively, rather than when prompted by  the pump.   - Even with these, he is able to stay in the automatic mode for 73% of the time - Otherwise, he is doing a good job entering carbs with meals and bolusing before almost every meal.  Occasionally, he is not bolusing before meals and sugars increase significantly.  However, these instances are rare.  I do not feel we need to change his regimen at this point. - I suggested to:  Patient Instructions  Please continue: - basal rates: 12 am: 1.6 8 am: 1.6 4 pm: 1.6 9 pm: 1.5 - ICR:  12 am: 4 6 am: 3.5 11:30 am: 3.5 6 pm: 4 - target: 110-120  - ISF:   12 am: 40 - Insulin on Board: 3h  Can try the following combination for neuropathy: - alpha-lipoic acid 600 mg twice a day - B complex 1 tablet once a day  Please return in 3-4 months with your sugar log.    - today, HbA1c is 7.7% (slightly lower) - continue checking sugars at different times of the day - check 4x a day, rotating checks - advised for yearly eye exams >> he is UTD - Return to clinic in 3-4 mo with sugar log   2. HL - Reviewed latest lipid panel from 06/2017: All fractions at goal Lab Results  Component Value Date   CHOL 115 06/25/2017   HDL 43.90 06/25/2017   LDLCALC 60 06/25/2017   TRIG 54.0 06/25/2017   CHOLHDL 3 06/25/2017  - Continues Lipitor without side effects.  3. Obesity -He continues to gain weight, 17 pounds in the last 8 months.  4.  Peripheral neuropathy -worse -Continues on Lyrica -I suggested to start alpha lipoic acid and B complex.  As we plan to check annual labs at next visit including a TSH, I advised him not to take  a B complex in the day of the visit to avoid biotin interference  Kenneth Kingdom, MD PhD [facility] Endocrinology

## 2018-03-08 DIAGNOSIS — G4733 Obstructive sleep apnea (adult) (pediatric): Secondary | ICD-10-CM | POA: Diagnosis not present

## 2018-04-01 DIAGNOSIS — R05 Cough: Secondary | ICD-10-CM | POA: Diagnosis not present

## 2018-04-01 DIAGNOSIS — J101 Influenza due to other identified influenza virus with other respiratory manifestations: Secondary | ICD-10-CM | POA: Diagnosis not present

## 2018-04-01 DIAGNOSIS — J029 Acute pharyngitis, unspecified: Secondary | ICD-10-CM | POA: Diagnosis not present

## 2018-04-01 DIAGNOSIS — R509 Fever, unspecified: Secondary | ICD-10-CM | POA: Diagnosis not present

## 2018-04-05 ENCOUNTER — Other Ambulatory Visit: Payer: Self-pay

## 2018-04-05 MED ORDER — PREGABALIN 50 MG PO CAPS: 50.0000 mg | ORAL_CAPSULE | Freq: Every day | ORAL | 0 refills | Status: DC

## 2018-04-05 NOTE — Telephone Encounter (Signed)
Received refill fax request.  Please advise.

## 2018-04-05 NOTE — Telephone Encounter (Signed)
OK 

## 2018-05-16 ENCOUNTER — Encounter (INDEPENDENT_AMBULATORY_CARE_PROVIDER_SITE_OTHER): Payer: Self-pay | Admitting: Physical Medicine and Rehabilitation

## 2018-05-18 IMAGING — CT CT HEAD W/O CM
3 series · 15 of 47 positions shown, 18 images · non-contrast
Comparison: 02/02/2008 head CT report

CLINICAL DATA: Worsening headache since last evening. Blurriness of
vision.

EXAM:
CT HEAD WITHOUT CONTRAST
TECHNIQUE: Contiguous axial images were obtained from the base of the skull
through the vertex without intravenous contrast.

[Series 2: head wo · axial · 0.43mm/px · z∈[-156,-31]mm · 9 of 31 slices shown, 12 images]
[im 3/31  brain]
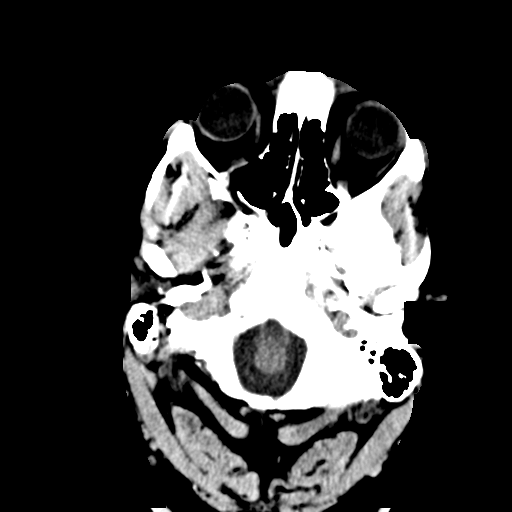
[im 3/31  bone]
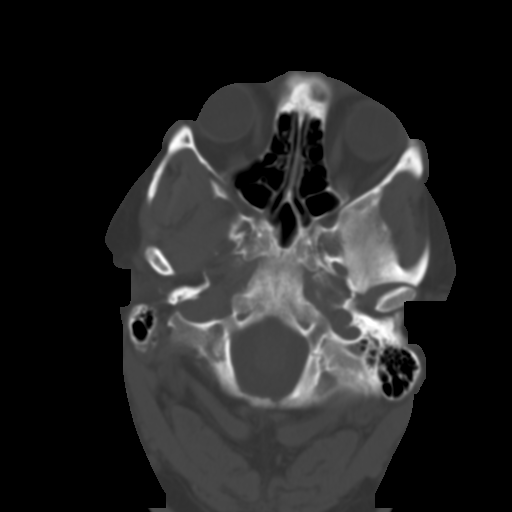
[im 6/31  brain]
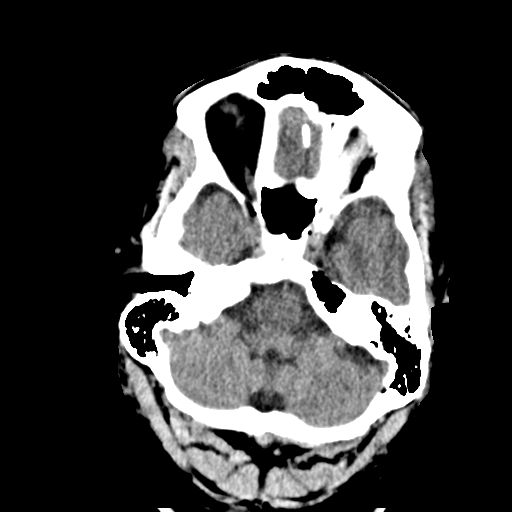
[im 9/31  brain]
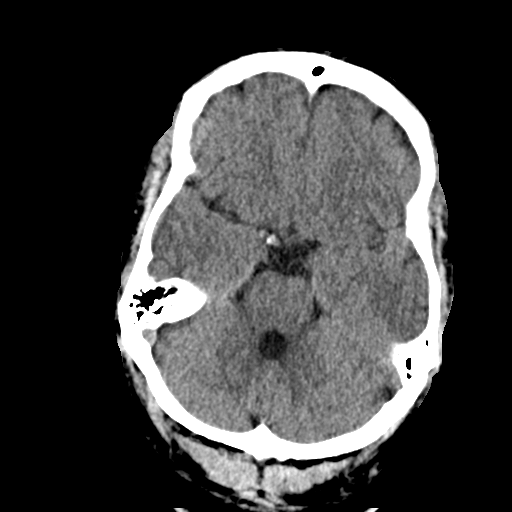
[im 12/31  brain]
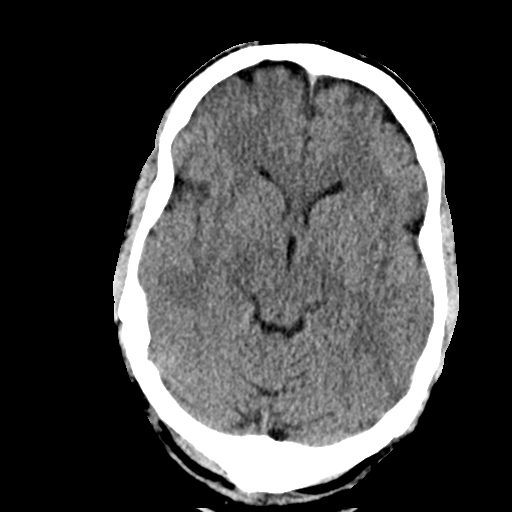
[im 16/31  brain]
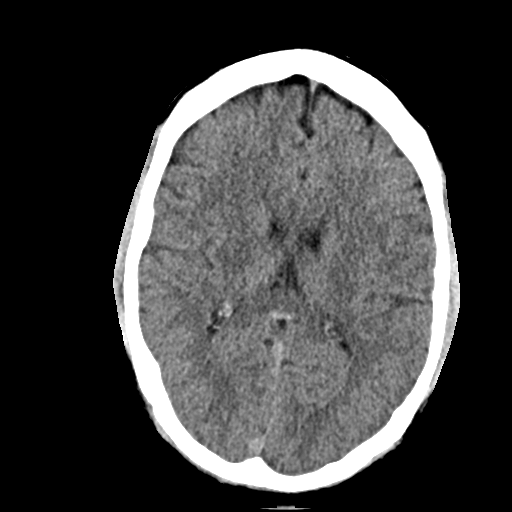
[im 16/31  bone]
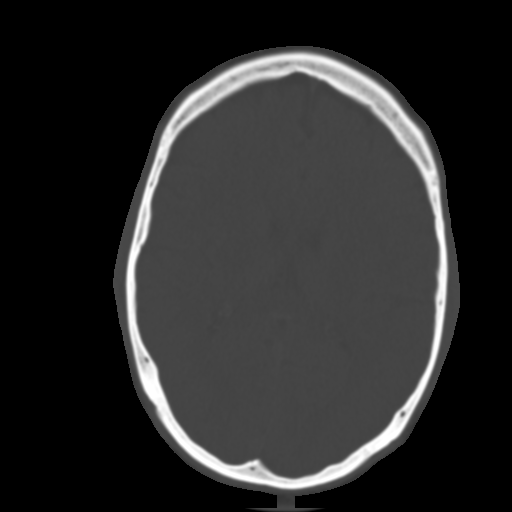
[im 19/31  brain]
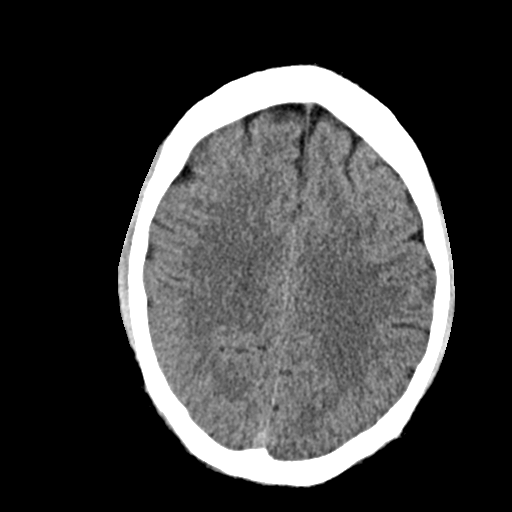
[im 22/31  brain]
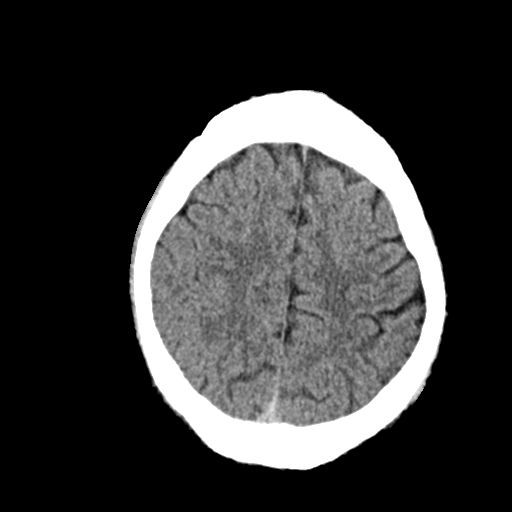
[im 25/31  brain]
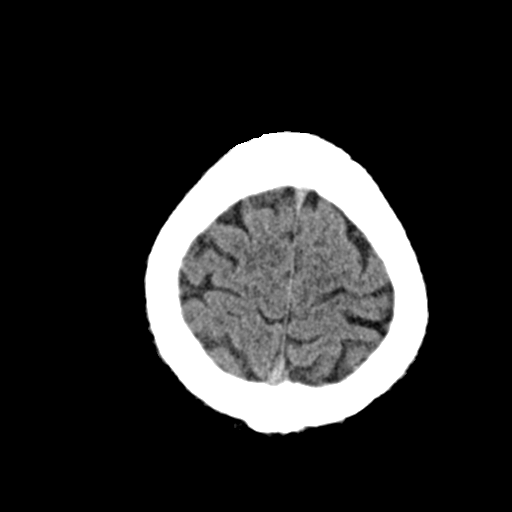
[im 28/31  brain]
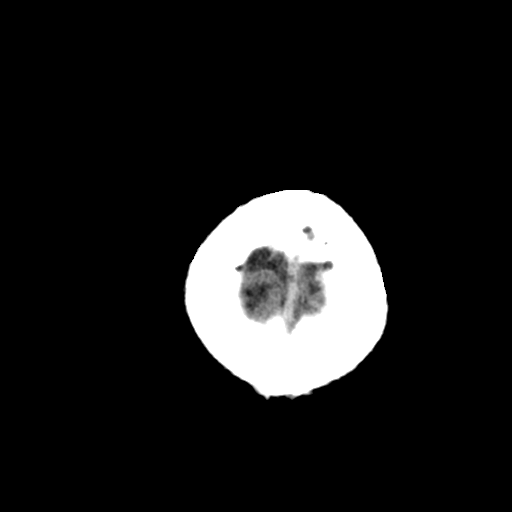
[im 28/31  bone]
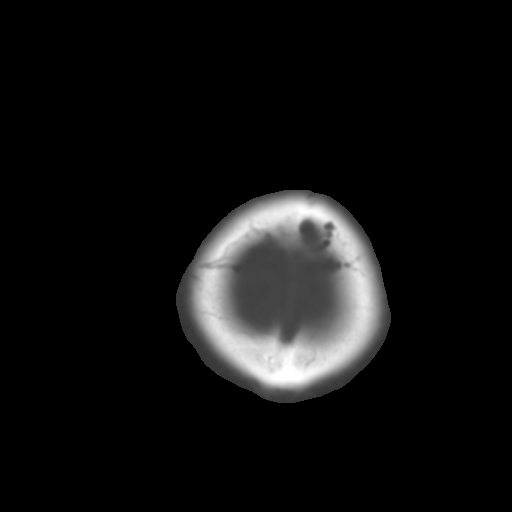

[Series 4: coronal soft · coronal · 0.31mm/px · 3 of 73 slices shown]
[im 25/73  brain]
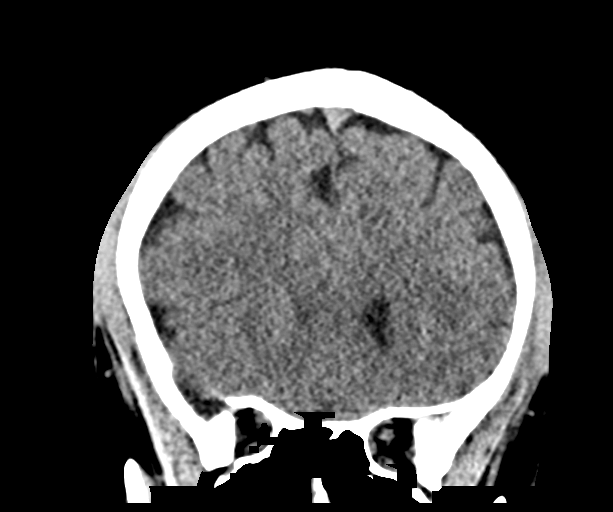
[im 33/73  brain]
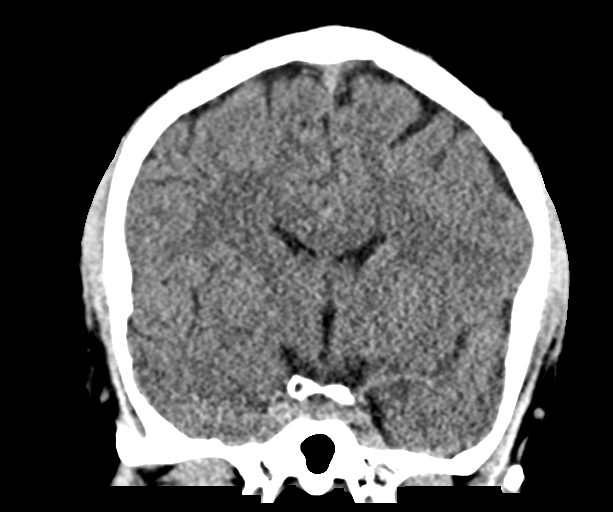
[im 41/73  brain]
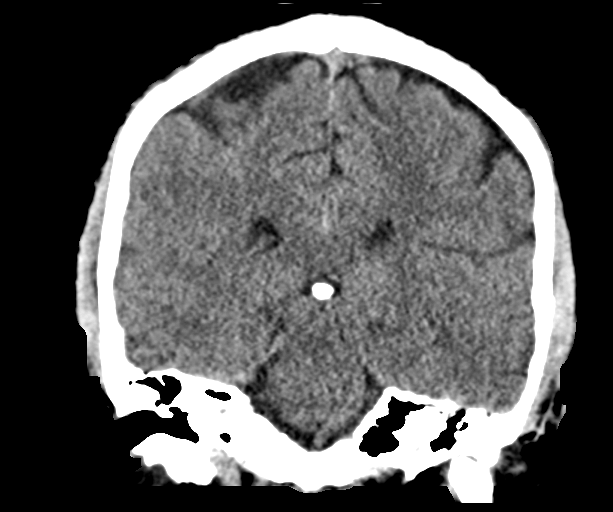

[Series 5: sag soft · sagittal · 0.30mm/px · 3 of 58 slices shown]
[im 20/58  brain]
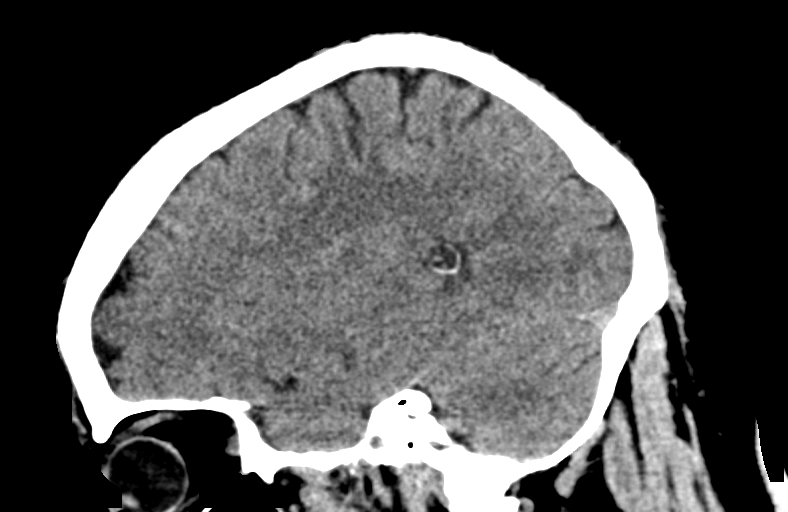
[im 29/58  brain]
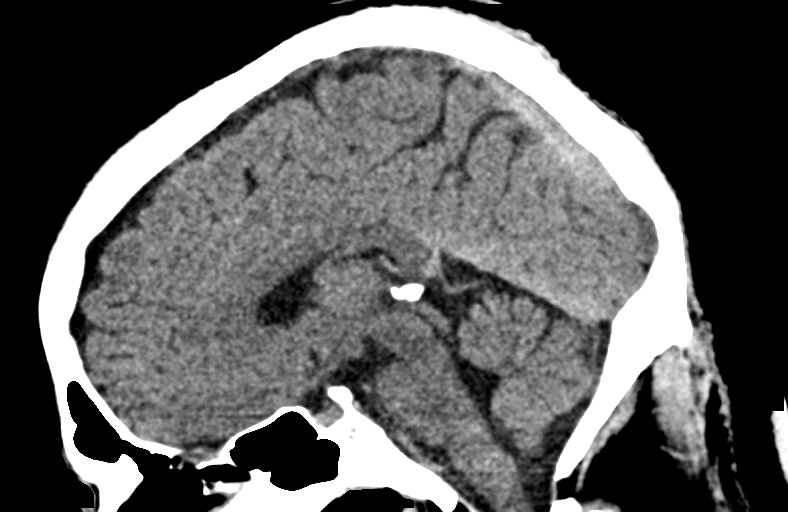
[im 39/58  brain]
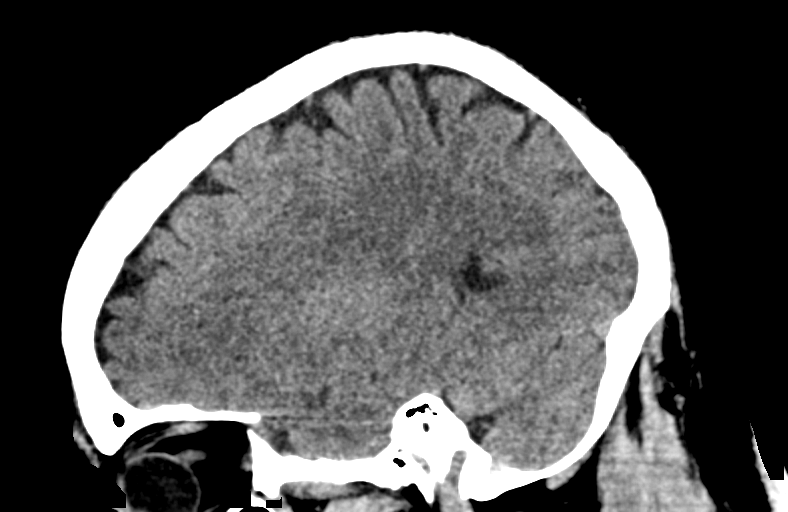

[15 of 47 positions shown; findings below may reference images not displayed]

FINDINGS: Brain: No evidence of acute infarction, hemorrhage, hydrocephalus,
extra-axial collection or mass lesion/mass effect.

Vascular: No hyperdense vessel or unexpected calcification.

Skull: Normal. Negative for fracture or focal lesion.

Sinuses/Orbits: No acute finding.

Other: None
IMPRESSION: No acute intracranial abnormality.

## 2018-05-19 ENCOUNTER — Encounter (INDEPENDENT_AMBULATORY_CARE_PROVIDER_SITE_OTHER): Payer: Self-pay | Admitting: Orthopedic Surgery

## 2018-05-20 NOTE — Telephone Encounter (Signed)
I would start with aggressive range of motion exercises and if that does not help along with anti-inflammatories then coming in for an injection is the next that.

## 2018-05-24 ENCOUNTER — Ambulatory Visit: Payer: Self-pay | Admitting: Neurology

## 2018-05-25 ENCOUNTER — Ambulatory Visit (INDEPENDENT_AMBULATORY_CARE_PROVIDER_SITE_OTHER): Payer: 59 | Admitting: Neurology

## 2018-05-25 ENCOUNTER — Encounter: Payer: Self-pay | Admitting: Neurology

## 2018-05-25 ENCOUNTER — Other Ambulatory Visit: Payer: Self-pay

## 2018-05-25 ENCOUNTER — Telehealth: Payer: Self-pay

## 2018-05-25 DIAGNOSIS — G43109 Migraine with aura, not intractable, without status migrainosus: Secondary | ICD-10-CM

## 2018-05-25 MED ORDER — TOPIRAMATE 50 MG PO TABS: 50.0000 mg | ORAL_TABLET | Freq: Every day | ORAL | 3 refills | Status: DC

## 2018-05-25 NOTE — Telephone Encounter (Signed)
Precharting for this visit has been completed.    Pt understands that although there may be some limitations with this type of visit, we will take all precautions to reduce any security or privacy concerns.  Pt understands that this will be treated like an in office visit and we will file with pt's insurance, and there may be a patient responsible charge related to this service.

## 2018-05-25 NOTE — Progress Notes (Signed)
     Virtual Visit via Telephone Note  I connected with Kenneth [name] on 05/25/18 at  2:00 PM EDT by telephone and verified that I am speaking with the correct person using two identifiers.   I discussed the limitations, risks, security and privacy concerns of performing an evaluation and management service by telephone and the availability of in person appointments. I also discussed with the patient that there may be a patient responsible charge related to this service. The patient expressed understanding and agreed to proceed.   History of Present Illness: Kenneth [name] is a 43 year old right-handed white male with a history of migraine headache.  Overall, he has been doing fairly well with his headaches, he has on average 1 a week but the headache is very responsive to Tylenol or Advil.  He indicates that he takes over-the-counter medications the headache will go away rapidly.  He is on low-dose Topamax taking 50 mg at night, he is tolerating medication well and he believes that this has significantly reduced the headache frequency.  The patient is working on a regular basis, he is not missing work because of headache.  He notes that occasionally his headache will be activated by elevated blood sugars, or a flash of light.  He has diabetes, his most recent hemoglobin A1c was around 7.8.  The patient reports some troubles with a frozen shoulder on the left, he is seeing orthopedic surgery for this.  Otherwise, he has not noted any new medical issues that have come up since last seen.  The patient is on Lyrica for diabetic neuropathy, he does have some discomfort in the hands and feet, he generally will sleep fairly well at night.   Observations/Objective: On the telephone interview, the patient appears to be bright and alert, he is responding to questions appropriately.  Speech is well enunciated, not aphasic or dysarthric.  Assessment and Plan: 1.  Migraine headache  2.  Diabetes  3.   Diabetic peripheral neuropathy  The patient is doing well with his migraine, we will continue the Topamax.  He will follow-up in 1 year, sooner if needed.  If he desires to have more aggressive treatment for the headache, the Topamax dosing can be increased.  A prescription for the Topamax was sent in.  Follow Up Instructions: 1 year follow-up with nurse practitioner.   I discussed the assessment and treatment plan with the patient. The patient was provided an opportunity to ask questions and all were answered. The patient agreed with the plan and demonstrated an understanding of the instructions.   The patient was advised to call back or seek an in-person evaluation if the symptoms worsen or if the condition fails to improve as anticipated.  I provided 15 minutes of non-face-to-face time during this encounter.   York Spaniel, MD

## 2018-06-06 DIAGNOSIS — E108 Type 1 diabetes mellitus with unspecified complications: Secondary | ICD-10-CM | POA: Diagnosis not present

## 2018-06-15 DIAGNOSIS — L729 Follicular cyst of the skin and subcutaneous tissue, unspecified: Secondary | ICD-10-CM | POA: Diagnosis not present

## 2018-06-15 DIAGNOSIS — L089 Local infection of the skin and subcutaneous tissue, unspecified: Secondary | ICD-10-CM | POA: Diagnosis not present

## 2018-06-16 ENCOUNTER — Ambulatory Visit: Payer: 59 | Admitting: Adult Health

## 2018-07-05 ENCOUNTER — Other Ambulatory Visit: Payer: Self-pay | Admitting: Internal Medicine

## 2018-07-05 NOTE — Telephone Encounter (Signed)
OK 

## 2018-07-05 NOTE — Telephone Encounter (Signed)
Please advise 

## 2018-07-11 ENCOUNTER — Other Ambulatory Visit: Payer: Self-pay | Admitting: Internal Medicine

## 2018-07-15 ENCOUNTER — Ambulatory Visit (INDEPENDENT_AMBULATORY_CARE_PROVIDER_SITE_OTHER): Payer: 59 | Admitting: Internal Medicine

## 2018-07-15 ENCOUNTER — Encounter: Payer: Self-pay | Admitting: Internal Medicine

## 2018-07-15 ENCOUNTER — Other Ambulatory Visit: Payer: Self-pay

## 2018-07-15 DIAGNOSIS — Z6838 Body mass index (BMI) 38.0-38.9, adult: Secondary | ICD-10-CM | POA: Diagnosis not present

## 2018-07-15 DIAGNOSIS — E785 Hyperlipidemia, unspecified: Secondary | ICD-10-CM

## 2018-07-15 DIAGNOSIS — E1042 Type 1 diabetes mellitus with diabetic polyneuropathy: Secondary | ICD-10-CM | POA: Diagnosis not present

## 2018-07-15 MED ORDER — GLUCAGON (RDNA) 1 MG IJ KIT: 1.0000 mg | PACK | Freq: Once | INTRAMUSCULAR | 99 refills | Status: AC | PRN

## 2018-07-15 NOTE — Patient Instructions (Addendum)
Please continue: - basal rates: 12 am: 1.6 >> 1.75 8 am: 1.6 >> 1.75 4 pm: 1.6 >> 1.75 9 pm: 1.5 >> 1.7 - ICR:  12 am: 4 6 am: 3.5 11:30 am: 3.5 6 pm: 4 - target: 110-120  - ISF:   12 am: 40 - Insulin on Board: 3h   Please return in 3-4 months with your sugar log.

## 2018-07-15 NOTE — Progress Notes (Signed)
Patient ID: Kenneth [name], male   DOB: 1975-05-05, 43 y.o.   MRN: [id number]  Patient location: Home My location: Office  I connected with the patient on 07/15/18 at  4:00 PM EDT by a video enabled telemedicine application and verified that I am speaking with the correct person.   I discussed the limitations of evaluation and management by telemedicine and the availability of in person appointments. The patient expressed understanding and agreed to proceed.   Details of the encounter are shown below.   HPI: Kenneth [name] is a 43 y.o.-year-old male, initially referred by his PCP, Dr. Radene Ou, presenting for f/u for DM1, dx 62 (age 70), uncontrolled, with complications (PN). He previously saw Dr. Meredith Pel and Dr. Hartford Poli. Last visit with me 4 months ago.  He started a new job in 02/2017.  His schedule is erratic as far his mealtimes.  Last hemoglobin A1c was: Lab Results  Component Value Date   HGBA1C 7.7 (A) 03/04/2018   HGBA1C 7.8 (A) 10/29/2017   HGBA1C 8.5 06/25/2017   He continues on an insulin pump: Medtronic 670 G since 01/2016, + guardian CGM since 02/2016 Insulin: Fiasp >> Humalog.  Supplies: [employer]  Pump settings: - basal rates: 12 am: 1.6 8 am: 1.6 4 pm: 1.6 9 pm: 1.5 - ICR:  12 am: 4 6 am: 3.5 11:30 am: 3.5 6 pm: 4 - target: 110-120  - ISF:   12 am: 40 - Insulin on Board: 3h TDD from basal insulin: 54% (53 units) >> 53% >> 54% >> 62% (57 units) TDD from bolus insulin: 46% (45 units) >> 47% >> 46% >> 38% (35 units) Total daily dose 90-120 units.  - changes infusion site: q1.6 days - Meter: Bayer Contour  We tried metformin ER 500 mg twice daily >> she developed nausea and stopped  Pt checks his sugars by fingerstick 2.1 x a day.  CGM alarms are: 65-250  CGM parameters: - Average from CGM:  178+/-56 >> 182 +/- 58 >> 212+/-62 - Average from manual BG checks:  193+/-72 >> 219 +/-82 >> 240+/-51  Time in range:  - very low (40-50):   0% >> 0% >> 0%  - low (50-70): 1% >> 0% >> 0% - normal range (70-180):  52% >> 53% >> 30% - high sugars (180-250): 35% >> 32% >> 44% - very high sugars (250-400):12% >> 15% >> 26%  - in auto mode: 72% >> 73% >> 28% - in manual mode:  20% >> 27% >> 72%   Lowest sugar was 39 (miscalculated carbs the night before) >> .Marland KitchenMarland Kitchen  45 (on insulin pen) >> 50s >> 53;  He has hypoglycemia awareness in the 70s.  No previous hypoglycemia admission.  He has a glucagon kit at home. 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  Highest sugar was 300s >> 350 (off the CGM); no previous DKA admissions  Pt's meals are: - Breakfast: snack cake, yoghurt - Lunch: sandwich, frozen foods - Dinner: pizza, tacos, steak, veggies, hot dogs - Snacks: chips, ice cream, yoghurt, fruit, 3-4x a day   -No CKD, last BUN/creatinine:  Lab Results  Component Value Date   BUN 21 06/25/2017   CREATININE 1.03 06/25/2017   He has a history of microalbuminuria, but normal ACR's lately: Lab Results  Component Value Date   MICRALBCREAT 0.5 06/25/2017   MICRALBCREAT 0.9 03/13/2016  07/27/2014: ACR 3.2.  On lisinopril.  -+ HL; last set of lipids: Lab Results  Component Value Date   CHOL 115 06/25/2017   HDL 43.90 06/25/2017   LDLCALC 60 06/25/2017   TRIG 54.0 06/25/2017   CHOLHDL 3 06/25/2017  On Lipitor.  - last eye exam was in 05/2017: No DR; + bilateral cataracts  -She does have numbness and tingling in his feet.  He is on Lyrica, which is helping. He tried ALA + B complex >> sxs worse.  Last TSH normal: Lab Results  Component Value Date   TSH 3.55 06/25/2017   ROS: Constitutional: + weight gain (~25 lbs)/no weight loss, no fatigue, no subjective hyperthermia, no subjective hypothermia Eyes: no blurry vision, no xerophthalmia ENT: no sore throat, no nodules palpated in neck, no dysphagia, no odynophagia, no hoarseness Cardiovascular: no CP/no SOB/no palpitations/no leg swelling Respiratory: no  cough/no SOB/no wheezing Gastrointestinal: no N/no V/no D/no C/no acid reflux Musculoskeletal: no muscle aches/no joint aches Skin: no rashes, no hair loss Neurological: no tremors/+ numbness/+ tingling/no dizziness  I reviewed pt's medications, allergies, PMH, social hx, family hx, and changes were documented in the history of present illness. Otherwise, unchanged from my initial visit note.   Past Medical History:  Diagnosis Date  . Classic migraine 08/05/2016  . Depression, major, single episode   . Diabetes mellitus without complication ([place])   . H/O cardiac catheterization    findings were normal  . Hypertension   . Injury of hip, left September 2015  . Sleep apnea    Past Surgical History:  Procedure Laterality Date  . CORONARY ARTERY BYPASS GRAFT  Around 2011 or 2012  . VASECTOMY    . WISDOM TOOTH EXTRACTION     x8   Social History   Social History  . Marital Status: Married    Spouse Name: N/A  . Number of Children: 3   Occupational History  . student   Social History Main Topics  . Smoking status: Never Smoker   . Smokeless tobacco: Never Used  . Alcohol Use: No  . Drug Use: No   Current Outpatient Medications on File Prior to Visit  Medication Sig Dispense Refill  . aspirin 81 MG EC tablet Take by mouth.    Marland Kitchen atorvastatin (LIPITOR) 20 MG tablet   2  . cetirizine (ZYRTEC) 10 MG tablet Take 10 mg by mouth daily.    . cholecalciferol (VITAMIN D) 1000 units tablet Take 2,000 Units by mouth daily.    . CONTOUR NEXT TEST test strip USE AS INSTRUCTED TO TEST 7 TIMES DAILY 700 each 3  . glucagon (GLUCAGON EMERGENCY) 1 MG injection Inject 1 mg into the muscle once as needed. 1 each prn  . insulin lispro (HUMALOG) 100 UNIT/ML injection USE IN INSULIN PUMP - UP TO 100 UNITS A DAY 90 mL 3  . lisinopril (PRINIVIL,ZESTRIL) 5 MG tablet Take by mouth.    . pantoprazole (PROTONIX) 40 MG tablet Take 40 mg by mouth daily.    . pregabalin (LYRICA) 50 MG capsule Take 1  capsule by mouth once daily 90 capsule 0  . topiramate (TOPAMAX) 50 MG tablet Take 1 tablet (50 mg total) by mouth at bedtime. 90 tablet 3   No current facility-administered medications on file prior to visit.    Allergies  Allergen Reactions  . Dilantin [Phenytoin] Other (See Comments)    Hallucinations  . Gabapentin Other (See Comments)    Causes hallucinations   Family History  Problem Relation Age of Onset  . Arthritis Mother   . Leukemia Mother   .  Diabetes Father   . Heart disease Father   . Cancer Father   . Migraines Father    PE: There were no vitals taken for this visit. Wt Readings from Last 3 Encounters:  03/04/18 262 lb (118.8 kg)  10/29/17 255 lb 6.4 oz (115.8 kg)  06/25/17 249 lb (112.9 kg)   Constitutional:  in NAD  The physical exam was not performed (virtual visit).  ASSESSMENT: 1. DM1, uncontrolled, with complications - PN  33/54/5625: C-peptide<0.1 (1.1-4.4 ng/mL), Glucose 176   2. HL  3. Obesity  4.  Peripheral neuropathy  PLAN:  1. Patient with longstanding, uncontrolled, type 1 diabetes, on Medtronic 670 G insulin pump with integrated CGM.  At this visit, he is quite stressed since he has been out of work since the coronavirus pandemic started almost 3 months ago.  He has gained 25 pounds and his sugars are high.  He has been frequently kicked out of the automatic mode in the last 2 weeks and we reviewed this traces along with the patient today.  We will scan these.  However, these are not very representative since [place] the beach [place] and he was off his sensor and definitely out of his usual environment.  Also, at the beginning of the 2-week period Covered by the sensor, he ran out of insulin and had to use insulin injections.  His sugars are not very well controlled and they failed to improve within the next few days afterwards.  He is now back on the sensor.  He also started to go to work maybe once or twice a week.  When he  is at home, he is waking up late and has a more disorganized schedule. -We discussed that he needs to stay in the automatic mode as much as possible and this will definitely help improving his blood sugars.  However, for now, since his sugars are mostly high in the manual mode we will increase his basal rate throughout the day.  I would not suggest to make other medication changes for now.  Regarding the insulin sensitivity factor, he feels that this is giving him enough correction.  His insulin to carb ratios are difficult to evaluate now but after we increase the rate, we may need to lower these especially in the context of his weight gain -On a positive note, he had less lows since last visit - I suggested to:  Patient Instructions  Please continue: - basal rates: 12 am: 1.6 >> 1.75 8 am: 1.6 >> 1.75 4 pm: 1.6 >> 1.75 9 pm: 1.5 >> 1.7 - ICR:  12 am: 4 6 am: 3.5 11:30 am: 3.5 6 pm: 4 - target: 110-120  - ISF:   12 am: 40 - Insulin on Board: 3h   Please return in 3-4 months with your sugar log.  -We will check his HbA1c when he returns to the clinic - continue checking sugars at different times of the day - check 4x a day, rotating checks - advised for yearly eye exams >> he is not UTD - Return to clinic in 3-4 mo with sugar log   2. HL - Reviewed latest lipid panel from 06/2017: At goal Lab Results  Component Value Date   CHOL 115 06/25/2017   HDL 43.90 06/25/2017   LDLCALC 60 06/25/2017   TRIG 54.0 06/25/2017   CHOLHDL 3 06/25/2017  - Continues Lipitor without side effects.  3. Obesity -Before last visit he gained 15  pounds in the previous 8 months -He does admit that since last visit he gained approximately 25 pounds... -Discussed the absolute need to improve diet.  He bought a bicycle and is planning to start using it.  4.  Peripheral neuropathy - stable -Continues on Lyrica -He tried alpha-lipoic acid and B complex but felt that his symptoms were worse on this   Philemon Kingdom, MD PhD [facility] Endocrinology

## 2018-09-22 ENCOUNTER — Telehealth: Payer: Self-pay | Admitting: *Deleted

## 2018-09-22 ENCOUNTER — Telehealth: Payer: Self-pay

## 2018-09-22 ENCOUNTER — Other Ambulatory Visit: Payer: Self-pay | Admitting: Internal Medicine

## 2018-09-22 ENCOUNTER — Other Ambulatory Visit: Payer: Self-pay

## 2018-09-22 MED ORDER — INSULIN LISPRO 100 UNIT/ML ~~LOC~~ SOLN: SUBCUTANEOUS | 3 refills | Status: DC

## 2018-09-22 MED ORDER — TOPIRAMATE 50 MG PO TABS: 50.0000 mg | ORAL_TABLET | Freq: Every day | ORAL | 2 refills | Status: DC

## 2018-09-22 MED ORDER — PREGABALIN 50 MG PO CAPS: 50.0000 mg | ORAL_CAPSULE | Freq: Every day | ORAL | 0 refills | Status: DC

## 2018-09-22 MED ORDER — PREGABALIN 50 MG PO CAPS: 50.0000 mg | ORAL_CAPSULE | Freq: Every day | ORAL | 1 refills | Status: DC

## 2018-09-22 NOTE — Telephone Encounter (Signed)
Received fax from [place] mail service to refill toiramate with them. Last refill in April to local phamracy. E scribed to [facility] per previous refill: disp #90, 2 refills.

## 2018-09-22 NOTE — Telephone Encounter (Signed)
Attempt was made to refill Rx for Lyrica and print Rx for Dr. Kelton Pillar to sign. However, unable to print Rx to any internal print for hard signature and for faxing purposes. Message routed to Dr. Kelton Pillar for her to e-Rx to [facility]

## 2018-10-27 ENCOUNTER — Encounter: Payer: Self-pay | Admitting: Internal Medicine

## 2018-10-27 NOTE — Progress Notes (Signed)
Received labs from PCP from 10/21/2018: -CMP normal with the exception of a high glucose of 123.  BUN/creatinine 14/1.01, EGFR 80 -Lipids: 135/85/47/72

## 2018-10-29 ENCOUNTER — Other Ambulatory Visit: Payer: Self-pay | Admitting: Internal Medicine

## 2018-10-31 NOTE — Telephone Encounter (Signed)
Last fill 09/22/18  #90ML/3 Last OV 07/15/18

## 2018-12-31 ENCOUNTER — Other Ambulatory Visit: Payer: Self-pay | Admitting: Internal Medicine

## 2019-01-02 NOTE — Telephone Encounter (Signed)
Dr. Cruzita Lederer,  Do you prescribe this?

## 2019-01-02 NOTE — Telephone Encounter (Signed)
Yes, OK to refill 

## 2019-01-03 ENCOUNTER — Telehealth: Payer: Self-pay

## 2019-01-03 NOTE — Telephone Encounter (Signed)
[employer] for Lyrica faxed to pharmacy.

## 2019-01-11 ENCOUNTER — Encounter: Payer: Self-pay | Admitting: Internal Medicine

## 2019-01-11 MED ORDER — ACCU-CHEK GUIDE VI STRP: ORAL_STRIP | 12 refills | Status: DC

## 2019-02-17 DIAGNOSIS — M6530 Trigger finger, unspecified finger: Secondary | ICD-10-CM | POA: Insufficient documentation

## 2019-02-24 ENCOUNTER — Other Ambulatory Visit: Payer: Self-pay

## 2019-02-28 ENCOUNTER — Ambulatory Visit: Payer: 59 | Admitting: Internal Medicine

## 2019-02-28 ENCOUNTER — Encounter: Payer: Self-pay | Admitting: Internal Medicine

## 2019-02-28 NOTE — Progress Notes (Signed)
CANCELLED SAME DAY  Patient ID: Kenneth [name], male   DOB: 03-Aug-1975, 44 y.o.   MRN: [id number]  This visit occurred during the SARS-CoV-2 public health emergency.  Safety protocols were in place, including screening questions prior to the visit, additional usage of staff PPE, and extensive cleaning of exam room while observing appropriate contact time as indicated for disinfecting solutions.   HPI: Kenneth [name] is a 44 y.o.-year-old male,year-old male, initially referred by his PCP, Dr. Radene Ou, returning for follow-up: DM1, dx 29 (age 44), uncontrolled, with complications (PN). He previously saw Dr. Meredith Pel and Dr. Hartford Poli. Last visit with me 8 months ago.  Since last visit, he changed his insulin pump: IOMBTDHRC 163 G MiniMed in 12/2018.  He likes this pump much better.  He did very well before the last 2 weeks, when he was on steroids and sugars are much higher.  Last hemoglobin A1c was: Lab Results  Component Value Date   HGBA1C 7.7 (A) 03/04/2018   HGBA1C 7.8 (A) 10/29/2017   HGBA1C 8.5 06/25/2017   Previous labs: AGTXMIWOE 321 G since 01/2016, + guardian CGM since 02/2016 Insulin: Fiasp >> Humalog.  Supplies: [employer]  Pump settings: - basal rates: 12 am: 1.8 8 am: 1.8 4 pm: 1.8 9 pm: 1.75 - ICR:  12 am: 4 6 am: 3.5 11:30 am: 3.5 6 pm: 4 - target: 110-120  - ISF:   12 am: 40 - Insulin on Board: 3h TDD from basal insulin: 54% (53 units) >> 53% >> 54% >> 62% (57 units) >> 45% (62 units) TDD from bolus insulin: 46% (45 units) >> 47% >> 46% >> 38% (35 units) >> 55% (76 units) Total daily dose up to 150 units.  - changes infusion site: q3 days - Meter: Bayer Contour >> Accu-Chek guide  We tried metformin ER 500 mg twice daily >> he developed nausea and stopped.  Pt checks his sugars by fingerstick 2.3 x a day.  CGM alarms are: 65-2 50  CGM parameters: - Average from CGM:  178+/-56 >> 182 +/- 58 >> 212+/-62 >> 189+/-66 in the last 2 weeks - Average from manual BG checks:   193+/-72 >> 219 +/-82 >> 240+/-51 >> 223+/-28 in the last 2 weeks  Time in range:  - very low (40-50):   0% >> 0% >> 0% >> 0% - low (50-70): 1% >> 0% >> 0% >> 1% - normal range (70-180):  52% >> 53% >> 30% >> 48% (however, 70% TIR before the last 2 weeks) - high sugars (180-250): 35% >> 32% >> 44% >> 31% - very high sugars (250-400):12% >> 15% >> 20%  - in auto mode: 72% >> 73% >> 28% >> 59% - in manual mode:  20% >> 27% >> 72% >> 31%  Last 2 weeks:   2 weeks prior:   Previously:  Lowest sugar was 39 (miscalculated carbs the night before) >> .Marland KitchenMarland Kitchen  45 (on insulin pen) >> 50s >> 53 >> **;  He has hypoglycemia awareness in the 70s.  No previous hypoglycemia admissions.  He has a glucagon kit at home. 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  Highest sugar was 300s >> 350 (off the CGM) >> **; no previous DKA admissions.  Pt's meals are: - Breakfast: snack cake, yoghurt - Lunch: sandwich, frozen foods - Dinner: pizza, tacos, steak, veggies, hot dogs - Snacks: chips, ice cream, yoghurt, fruit, 3-4x a day   -No CKD, last BUN/creatinine:  10/21/2018: 14/1.01,  EGFR 80 Lab Results  Component Value Date   BUN 21 06/25/2017   CREATININE 1.03 06/25/2017   He has a history of microalbuminuria, but recently normal ACR's Lab Results  Component Value Date   MICRALBCREAT 0.5 06/25/2017   MICRALBCREAT 0.9 03/13/2016  07/27/2014: ACR 3.2.  On lisinopril.  -+ HL; last set of lipids: 10/21/2018: 135/85/47/72 Lab Results  Component Value Date   CHOL 115 06/25/2017   HDL 43.90 06/25/2017   LDLCALC 60 06/25/2017   TRIG 54.0 06/25/2017   CHOLHDL 3 06/25/2017  On Lipitor.  - last eye exam was in 05/2017: No DR; he has bilateral cataracts.  -+ Numbness and tingling in his feet.  He is on Lyrica, which is helping. He tried ALA + B complex >> sxs worse.  Last TSH was normal: Lab Results  Component Value Date   TSH 3.55 06/25/2017   ROS: Constitutional: no  weight gain/no weight loss, no fatigue, no subjective hyperthermia, no subjective hypothermia Eyes: no blurry vision, no xerophthalmia ENT: no sore throat, no nodules palpated in neck, no dysphagia, no odynophagia, no hoarseness Cardiovascular: no CP/no SOB/no palpitations/no leg swelling Respiratory: no cough/no SOB/no wheezing Gastrointestinal: no N/no V/no D/no C/no acid reflux Musculoskeletal: no muscle aches/no joint aches Skin: no rashes, no hair loss Neurological: no tremors/+ numbness/+ tingling/no dizziness  I reviewed pt's medications, allergies, PMH, social hx, family hx, and changes were documented in the history of present illness. Otherwise, unchanged from my initial visit note.   Past Medical History:  Diagnosis Date  . Classic migraine 08/05/2016  . Depression, major, single episode   . Diabetes mellitus without complication ([place])   . H/O cardiac catheterization    findings were normal  . Hypertension   . Injury of hip, left September 2015  . Sleep apnea    Past Surgical History:  Procedure Laterality Date  . CORONARY ARTERY BYPASS GRAFT  Around 2011 or 2012  . VASECTOMY    . WISDOM TOOTH EXTRACTION     x8   Social History   Social History  . Marital Status: Married    Spouse Name: N/A  . Number of Children: 3   Occupational History  . student   Social History Main Topics  . Smoking status: Never Smoker   . Smokeless tobacco: Never Used  . Alcohol Use: No  . Drug Use: No   Current Outpatient Medications on File Prior to Visit  Medication Sig Dispense Refill  . aspirin 81 MG EC tablet Take by mouth.    Marland Kitchen atorvastatin (LIPITOR) 20 MG tablet   2  . cetirizine (ZYRTEC) 10 MG tablet Take 10 mg by mouth daily.    . cholecalciferol (VITAMIN D) 1000 units tablet Take 2,000 Units by mouth daily.    Marland Kitchen glucagon (GLUCAGON EMERGENCY) 1 MG injection Inject 1 mg into the muscle once as needed. 1 each prn  . glucose blood (ACCU-CHEK GUIDE) test strip To use with  insulin pump. Check blood sugar up to 7 times daily 700 each 12  . HUMALOG 100 UNIT/ML injection USE UP TO 100 UNITS IN INSULIN PUMP DAILY 270 mL 0  . lisinopril (PRINIVIL,ZESTRIL) 5 MG tablet Take by mouth.    . pantoprazole (PROTONIX) 40 MG tablet Take 40 mg by mouth daily.    . pregabalin (LYRICA) 50 MG capsule TAKE 1 CAPSULE BY MOUTH  DAILY 90 capsule 1  . topiramate (TOPAMAX) 50 MG tablet Take 1 tablet (50 mg total) by mouth at bedtime.  90 tablet 2   No current facility-administered medications on file prior to visit.   Allergies  Allergen Reactions  . Dilantin [Phenytoin] Other (See Comments)    Hallucinations  . Gabapentin Other (See Comments)    Causes hallucinations   Family History  Problem Relation Age of Onset  . Arthritis Mother   . Leukemia Mother   . Diabetes Father   . Heart disease Father   . Cancer Father   . Migraines Father    PE: There were no vitals taken for this visit. Wt Readings from Last 3 Encounters:  03/04/18 262 lb (118.8 kg)  10/29/17 255 lb 6.4 oz (115.8 kg)  06/25/17 249 lb (112.9 kg)   Constitutional: overweight, in NAD Eyes: PERRLA, EOMI, no exophthalmos ENT: moist mucous membranes, no thyromegaly, no cervical lymphadenopathy Cardiovascular: RRR, No MRG Respiratory: CTA B Gastrointestinal: abdomen soft, NT, ND, BS+ Musculoskeletal: no deformities, strength intact in all 4 Skin: moist, warm, no rashes Neurological: no tremor with outstretched hands, DTR normal in all 4  ASSESSMENT: 1. DM1, uncontrolled, with complications - PN  40/09/6759: C-peptide<0.1 (1.1-4.4 ng/mL), Glucose 176   2. HL  3. Obesity  4.  Peripheral neuropathy  PLAN:  1. Patient with longstanding, uncontrolled, type 1 diabetes, on Medtronic 770 G insulin pump now with integrated CGM.  Since last visit, he was able to start the new Medtronic insulin pump and he feels that this is working much better for him.  Indeed, his sugars were much better controlled before  the last 2 weeks, with an average of 157 based on CGM measurements and the GMI of 7.1%.  Reviewing the CGM downloads, it appears that, before the last 2 weeks, his sugars were higher at night, but at or close to goal in the rest of the day.  In the last 2 weeks, however, sugars have increased and this is most likely due to using steroids for half of this interval.  The sugars were significantly above target after breakfast but remained high throughout the day.  His average glucose level measured by CGM was 189 with the GMI of 7.8%.  He had a slightly lower time in the auto mode compared to before. -After he stopped steroids, his sugars improved significantly in the last 4 days of the last 2 weeks with most of the sugars being out of close to target. -Reviewing his pump downloads, he is doing a good job in reducing the amount of carbs he is eating and bolusing with his meals.  He uses large amounts of insulin he is insulin resistant -He also does a good job changing his pump site every 3 days -At this visit, I do not feel that we absolutely need to change his regimen  - - I suggested to:  Patient Instructions  Please continue: - basal rates: 12 am: 1.8 8 am: 1.8 4 pm: 1.8 9 pm: 1.75 - ICR:  12 am: 4 6 am: 3.5 11:30 am: 3.5 6 pm: 4 - target: 110-120  - ISF:   12 am: 40 - Insulin on Board: 3h   Please return in 3-4 months with your sugar log.  - we checked his HbA1c: 7%  - advised to check sugars at different times of the day - 4x a day, rotating check times - advised for yearly eye exams >> he is UTD - return to clinic in 3-4 months  2. HL -Reviewed latest lipid panel from last year (10/21/2018): LDL higher, but at goal, the rest  of the fractions also at goal. -Continues Lipitor without side effects.  3. Obesity -Before last visit, he gained approximately 25 pounds -At that time, we discussed about the absolute need to improve diet.  He also bought a bicycle and plans to start  exercising  4.  Peripheral neuropathy -Stable, no new complaints -Continues on Lyrica -He tried alpha-lipoic acid and B complex but felt that his symptoms were worse on this   Philemon Kingdom, MD PhD [facility] Endocrinology

## 2019-03-07 ENCOUNTER — Other Ambulatory Visit: Payer: Self-pay | Admitting: Neurology

## 2019-04-16 ENCOUNTER — Other Ambulatory Visit: Payer: Self-pay | Admitting: Neurology

## 2019-04-17 ENCOUNTER — Ambulatory Visit: Payer: 59 | Admitting: Internal Medicine

## 2019-05-16 ENCOUNTER — Other Ambulatory Visit: Payer: Self-pay

## 2019-05-18 ENCOUNTER — Other Ambulatory Visit: Payer: Self-pay

## 2019-05-18 ENCOUNTER — Encounter: Payer: Self-pay | Admitting: Internal Medicine

## 2019-05-18 ENCOUNTER — Ambulatory Visit: Payer: 59 | Admitting: Internal Medicine

## 2019-05-18 VITALS — BP 122/70 | HR 72 | Ht 69.0 in | Wt 281.0 lb

## 2019-05-18 DIAGNOSIS — E1042 Type 1 diabetes mellitus with diabetic polyneuropathy: Secondary | ICD-10-CM

## 2019-05-18 DIAGNOSIS — E785 Hyperlipidemia, unspecified: Secondary | ICD-10-CM | POA: Diagnosis not present

## 2019-05-18 LAB — POCT GLYCOSYLATED HEMOGLOBIN (HGB A1C): Hemoglobin A1C: 8.3 % — AB (ref 4.0–5.6)

## 2019-05-18 NOTE — Addendum Note (Signed)
Addended by: Darliss Ridgel I on: 05/18/2019 10:25 AM   Modules accepted: Orders

## 2019-05-18 NOTE — Progress Notes (Signed)
Patient ID: Kenneth [name], male   DOB: 05-26-1975, 44 y.o.   MRN: [id number]  This visit occurred during the SARS-CoV-2 public health emergency.  Safety protocols were in place, including screening questions prior to the visit, additional usage of staff PPE, and extensive cleaning of exam room while observing appropriate contact time as indicated for disinfecting solutions.   HPI: Kenneth [name] is a 44 y.o.-year-old male, initially referred by his PCP, Dr. Radene Ou, returning for follow-up: DM1, dx 87 (age 48), uncontrolled, with complications (PN). He previously saw Dr. Meredith Pel and Dr. Hartford Poli. Last visit with me  10 mo ago.  He got Covid19 in 03/2019.  He recovered completely.  He eats less since then as he gets full quicker.  However, sugars are slightly higher.  Reviewed HbA1c levels: Lab Results  Component Value Date   HGBA1C 7.7 (A) 03/04/2018   HGBA1C 7.8 (A) 10/29/2017   HGBA1C 8.5 06/25/2017   He is on insulin pump: Medtronic 770 G MiniMed-started 12/2018.  He likes this pump much better. Previous PUMP: Medtronic 670 G since 01/2016, + guardian CGM since 02/2016 Insulin: Fiasp >> Humalog.  Supplies: [employer]  Pump settings: - basal rates: 12 am: 1.8 8 am: 1.8 4 pm: 1.8 9 pm: 1.75 - ICR:  12 am: 4 6 am: 3.5 11:30 am: 3.5 6 pm: 4 - target: 110-120  - ISF:   12 am: 40 - Insulin on Board: 3h TDD from basal insulin: 62% (57 units) >> 45% (62 units) >> 50% TDD from bolus insulin: 38% (35 units) >> 55% (76 units) >> 50% Total daily dose up to 150 units.  - changes infusion site: q3 days - Meter: Bayer Contour >> Accu-Chek guide  We tried metformin ER 500 mg twice daily >> developed nausea and stopped.  Pt checks his sugars by fingerstick 0.3 times a day and calibrates the sensor 1.8 times a day.  CGM alarms are: 65 and 250  CGM parameters: - Average from CGM:  212+/-62 >> 189+/-66 >> 164+/-57 in the last 2 weeks - Average from manual BG checks: 240+/-51 >>  223+/-28  >> 256+/-61 in the last 2 weeks  Time in range:  - very low (40-50):   0% >> 0% >> 0% >> 0% >> 0% - low (50-70): 1% >> 0% >> 0% >> 1% >> 0% - normal range (70-180):  52% >> 53% >> 30% >> 48% >> 60% - high sugars (180-250): 35% >> 32% >> 44% >> 31% >> 24% - very high sugars (250-400):12% >> 15% >> 20% >> 8%  - in auto mode: 28% >> 59% >> 61% - in manual mode:  72% >> 31% >> 39%   In 02/2019:   2 weeks prior:   Previously:  Lowest sugar was 39 (miscalculated carbs the night before) >> .Marland KitchenMarland Kitchen  45 (on insulin pen) >> 50s >> 53 >> 40s;  He has hypoglycemia awareness in the 70s.  No previous hypoglycemia admissions.  He has a glucagon kit at home. 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  Highest sugar was 300s >> 350 (off the CGM) >> 300s; no previous DKA admissions.  Pt's meals are: - Breakfast: snack cake, yoghurt - Lunch: sandwich, frozen foods - Dinner: pizza, tacos, steak, veggies, hot dogs - Snacks: chips, ice cream, yoghurt, fruit, 3-4x a day   -No CKD, last BUN/creatinine:  10/21/2018: 14/1.01, GFR 80 Lab Results  Component Value Date   BUN 21 06/25/2017  CREATININE 1.03 06/25/2017   He has a history of microalbuminuria but recently normal ACR's Lab Results  Component Value Date   MICRALBCREAT 0.5 06/25/2017   MICRALBCREAT 0.9 03/13/2016  07/27/2014: ACR 3.2.  On lisinopril.  -+ HL; last set of lipids: 10/21/2018: 135/85/47/72 Lab Results  Component Value Date   CHOL 115 06/25/2017   HDL 43.90 06/25/2017   LDLCALC 60 06/25/2017   TRIG 54.0 06/25/2017   CHOLHDL 3 06/25/2017  On Lipitor.  - last eye exam was in 05/2017: No DR.  He has bilateral cataracts.  -He has numbness and tingling in his feet.  He is on Lyrica, which is helping.Marland Kitchen  He tried alpha-lipoic acid and B complex but his symptoms got worse.    Latest TSH was normal: Lab Results  Component Value Date   TSH 3.55 06/25/2017   ROS: Constitutional: no weight  gain/no weight loss, no fatigue, no subjective hyperthermia, no subjective hypothermia Eyes: no blurry vision, no xerophthalmia ENT: no sore throat, no nodules palpated in neck, no dysphagia, no odynophagia, no hoarseness Cardiovascular: no CP/no SOB/no palpitations/no leg swelling Respiratory: no cough/no SOB/no wheezing Gastrointestinal: no N/no V/no D/no C/no acid reflux Musculoskeletal: no muscle aches/no joint aches Skin: no rashes, no hair loss Neurological: no tremors/+ numbness/+ tingling/no dizziness  I reviewed pt's medications, allergies, PMH, social hx, family hx, and changes were documented in the history of present illness. Otherwise, unchanged from my initial visit note.   Past Medical History:  Diagnosis Date  . Classic migraine 08/05/2016  . Depression, major, single episode   . Diabetes mellitus without complication ([place])   . H/O cardiac catheterization    findings were normal  . Hypertension   . Injury of hip, left September 2015  . Sleep apnea    Past Surgical History:  Procedure Laterality Date  . CORONARY ARTERY BYPASS GRAFT  Around 2011 or 2012  . VASECTOMY    . WISDOM TOOTH EXTRACTION     x8   Social History   Social History  . Marital Status: Married    Spouse Name: N/A  . Number of Children: 3   Occupational History  . student   Social History Main Topics  . Smoking status: Never Smoker   . Smokeless tobacco: Never Used  . Alcohol Use: No  . Drug Use: No   Current Outpatient Medications on File Prior to Visit  Medication Sig Dispense Refill  . aspirin 81 MG EC tablet Take by mouth.    Marland Kitchen atorvastatin (LIPITOR) 20 MG tablet   2  . cetirizine (ZYRTEC) 10 MG tablet Take 10 mg by mouth daily.    . cholecalciferol (VITAMIN D) 1000 units tablet Take 2,000 Units by mouth daily.    Marland Kitchen glucagon (GLUCAGON EMERGENCY) 1 MG injection Inject 1 mg into the muscle once as needed. 1 each prn  . glucose blood (ACCU-CHEK GUIDE) test strip To use with insulin  pump. Check blood sugar up to 7 times daily 700 each 12  . HUMALOG 100 UNIT/ML injection USE UP TO 100 UNITS IN INSULIN PUMP DAILY 270 mL 0  . lisinopril (PRINIVIL,ZESTRIL) 5 MG tablet Take by mouth.    . pantoprazole (PROTONIX) 40 MG tablet Take 40 mg by mouth daily.    . pregabalin (LYRICA) 50 MG capsule TAKE 1 CAPSULE BY MOUTH  DAILY 90 capsule 1  . topiramate (TOPAMAX) 50 MG tablet TAKE 1 TABLET BY MOUTH AT  BEDTIME 90 tablet 3   No current facility-administered medications  on file prior to visit.   Allergies  Allergen Reactions  . Dilantin [Phenytoin] Other (See Comments)    Hallucinations  . Gabapentin Other (See Comments)    Causes hallucinations   Family History  Problem Relation Age of Onset  . Arthritis Mother   . Leukemia Mother   . Diabetes Father   . Heart disease Father   . Cancer Father   . Migraines Father    PE: BP 122/70   Pulse 72   Ht 5' 9"  (1.753 m)   Wt 281 lb (127.5 kg)   SpO2 99%   BMI 41.50 kg/m  Wt Readings from Last 3 Encounters:  05/18/19 281 lb (127.5 kg)  03/04/18 262 lb (118.8 kg)  10/29/17 255 lb 6.4 oz (115.8 kg)   Constitutional: overweight, in NAD Eyes: PERRLA, EOMI, no exophthalmos ENT: moist mucous membranes, no thyromegaly, no cervical lymphadenopathy Cardiovascular: RRR, No MRG Respiratory: CTA B Gastrointestinal: abdomen soft, NT, ND, BS+ Musculoskeletal: no deformities, strength intact in all 4 Skin: moist, warm, no rashes Neurological: no tremor with outstretched hands, DTR normal in all 4  ASSESSMENT: 1. DM1, uncontrolled, with complications - PN  12/81/1886: C-peptide<0.1 (1.1-4.4 ng/mL), Glucose 176   2. HL  3. Peripheral neuropathy  PLAN:  1. Patient with longstanding, uncontrolled, type 1 diabetes, on Medtronic 770 G insulin pump, now with integrated CGM.  Since last visit, he was able to start the new Medtronic insulin and he feels that this is working better for him.   -Reviewing the CGM downloads (will be  scanned today) it appears that sugars are higher at night and also after lunch.  Overall, however, his sugars are better compared to last visit.  He is in target range only 68% of the time in the last 2 weeks, compared to 73% in the previous 2 weeks.   -He is doing a good job introducing the carbs he is eating into the pump, however reviewed the tracings, he is bolusing too late, only at or after he starts eating.  We discussed that this is not conducive to good control but to increase variability blood sugars.  Indeed, he is kicked out from the automatic mode many times, due to high blood sugars and occasionally due to no calibration.  We discussed that it is mandatory to calibrate at least 2 times a day but ideally 4. -Sugars are higher after his first meal of the day, which is later in the morning, and also after dinner, which is late, around 9 PM to 12 AM.  We discussed about strengthening the insulin to carb ratio with his dinner.  However, for the higher blood sugars in the middle of the day, we discussed about increasing the basal rates. -He does a good job changing his infusion site approximately every 3 days -He gained almost 20 pounds since last visit.  He is aware that he needs to improve his diet.  He is very busy at work and not having consistent mealtimes.  We discussed that this is very important.  I advised him to try to space his meals 4 to 5 hours apart. -We discussed about changes in his regimen if he gets a steroid shot in his shoulder -change insulin to carb ratio to 1:3 as a start. - I suggested to:  Patient Instructions  Please continue: - basal rates: 12 am: 1.8 8 am: 1.8 >> 1.85 4 pm: 1.8 9 pm: 1.75 - ICR:  12 am: 4 6 am: 3.5 11:30 am:  3.5 6 pm: 4 >> 3.5 - target: 110-120  - ISF: 12 am: 40 - Insulin on Board: 3h  Please return in 3-4 months.  - we checked his HbA1c: 8.3% (higher) - advised to check sugars at different times of the day - 4x a day, rotating check  times - advised for yearly eye exams >> he is UTD - return to clinic in 3-4 months  2. HL -Reviewed latest lipid panel from 10/2018: LDL is higher, but at goal, the rest of the fractions also at goal -Continues Lipitor without side effects  3.  Peripheral neuropathy -Stable, no new complaints -Continues on Lyrica -He tried alpha-lipoic acid and B complex but felt his symptoms were worse on this   Philemon Kingdom, MD PhD [facility] Endocrinology

## 2019-05-18 NOTE — Patient Instructions (Addendum)
Please continue: - basal rates: 12 am: 1.8 8 am: 1.8 >> 1.85 4 pm: 1.8 9 pm: 1.75 - ICR:  12 am: 4 6 am: 3.5 11:30 am: 3.5 6 pm: 4 >> 3.5 - target: 110-120  - ISF: 12 am: 40 - Insulin on Board: 3h  Please return in 3-4 months.

## 2019-06-20 ENCOUNTER — Other Ambulatory Visit: Payer: Self-pay

## 2019-06-20 ENCOUNTER — Encounter: Payer: Self-pay | Admitting: Physical Therapy

## 2019-06-20 ENCOUNTER — Ambulatory Visit: Payer: 59 | Attending: Family Medicine | Admitting: Physical Therapy

## 2019-06-20 DIAGNOSIS — M25612 Stiffness of left shoulder, not elsewhere classified: Secondary | ICD-10-CM | POA: Insufficient documentation

## 2019-06-20 DIAGNOSIS — M25512 Pain in left shoulder: Secondary | ICD-10-CM | POA: Insufficient documentation

## 2019-06-20 NOTE — Patient Instructions (Signed)
Access Code: [email or web address] URL: [email or web address] Date: 06/20/2019 Prepared by: Stacie Glaze  Exercises Supine Shoulder Flexion AAROM with Hands Clasped - 2 x daily - 7 x weekly - 3 sets - 5 reps - 10 hold Standing Shoulder Internal Rotation Stretch with Towel - 2 x daily - 7 x weekly - 3 sets - 5 reps - 10 hold Standing Bilateral Shoulder Internal Rotation AAROM with Dowel - 2 x daily - 7 x weekly - 3 sets - 5 reps - 10 hold Standing Shoulder Extension ROM with Dowel - 2 x daily - 7 x weekly - 3 sets - 5 reps - 10 hold Seated Shoulder Flexion Towel Slide at Table Top Full Range of Motion - 2 x daily - 7 x weekly - 3 sets - 5 reps - 10 hold Seated Shoulder External Rotation PROM on Table - 2 x daily - 7 x weekly - 3 sets - 5 reps - 10 hold

## 2019-06-20 NOTE — Therapy (Signed)
[facility] [place] [address] [place], [place], [address] Phone: [phone]   Fax:  [phone]  Physical Therapy Evaluation  Patient Details  Name: Kenneth [name] MRN: [id number] Date of Birth: 1975-02-20 Referring Provider (PT): Meridee Score   Encounter Date: 06/20/2019  PT End of Session - 06/20/19 1041    Visit Number  1    Number of Visits  23    Date for PT Re-Evaluation  08/20/19    Authorization Type  [employer]    PT Start Time  1012    PT Stop Time  1046    PT Time Calculation (min)  34 min    Activity Tolerance  Patient tolerated treatment well    Behavior During Therapy  [facility] for tasks assessed/performed       Past Medical History:  Diagnosis Date  . Classic migraine 08/05/2016  . Depression, major, single episode   . Diabetes mellitus without complication (HCC)   . H/O cardiac catheterization    findings were normal  . Hypertension   . Injury of hip, left September 2015  . Sleep apnea     Past Surgical History:  Procedure Laterality Date  . CORONARY ARTERY BYPASS GRAFT  Around 2011 or 2012  . VASECTOMY    . WISDOM TOOTH EXTRACTION     x8    There were no vitals filed for this visit.   Subjective Assessment - 06/20/19 1011    Subjective  Patient reports that he does not remember an injury, but reports that for about 18 months he has had decreased left shoulder ROM with pain, he reports that he is diabetic reports that the issues was slow progression.  He is right handed.  He reports a cortisone injection in 2019 that helped some, reports due to covid he did not do any treatment last year    Limitations  House hold activities    Patient Stated Goals  have better ROM and less pain    Currently in Pain?  No/denies    Pain Score  0-No pain    Pain Location  Shoulder    Pain Orientation  Left    Pain Descriptors / Indicators  Aching;Tightness    Pain Type  Chronic pain    Pain Radiating Towards  reports  occasional numbness in the left hand    Pain Onset  More than a month ago    Pain Frequency  Intermittent    Aggravating Factors   movements of the left shoulder behind back and up, lying on the left sidereports pain up to 10/10    Pain Relieving Factors  not moving pain will be 0/10    Effect of Pain on Daily Activities  decreased ROM, pain, difficulty with ADL's         [facility] PT Assessment - 06/20/19 0001      Assessment   Medical Diagnosis  left shoulder pain and stiffness    Referring Provider (PT)  Meridee Score    Onset Date/Surgical Date  05/21/19    Hand Dominance  Right    Prior Therapy  no      Precautions   Precautions  None      Balance Screen   Has the patient fallen in the past 6 months  No    Has the patient had a decrease in activity level because of a fear of falling?   No    Is the patient reluctant to leave  their home because of a fear of falling?   No      Home Environment   Additional Comments  does housework, some yardwork, has a 29 year old grandchild      Prior Function   Level of Independence  Independent    Vocation  Full time employment    Vocation Requirements  lifts up to 40#, some reaching, mostly driving      Posture/Postural Control   Posture Comments  fwd head, rounded shoulders      ROM / Strength   AROM / PROM / Strength  PROM;AROM;Strength      AROM   Overall AROM Comments  all motions limited by pain    AROM Assessment Site  Shoulder    Right/Left Shoulder  Left    Left Shoulder Flexion  130 Degrees    Left Shoulder ABduction  110 Degrees    Left Shoulder Internal Rotation  30 Degrees    Left Shoulder External Rotation  35 Degrees      PROM   Overall PROM Comments  limited by pain and tightness    PROM Assessment Site  Shoulder    Right/Left Shoulder  Left    Left Shoulder Flexion  145 Degrees    Left Shoulder ABduction  122 Degrees    Left Shoulder Internal Rotation  38 Degrees    Left Shoulder External Rotation  41 Degrees       Strength   Overall Strength Comments  4/5 with some pain      Palpation   Palpation comment  he is very tight in the upper trap and pectorals, reports non tender      Special Tests   Other special tests  negative empty can, positive impingement                Objective measurements completed on examination: See above findings.      [facility] Adult PT Treatment/Exercise - 06/20/19 0001      Exercises   Exercises  Shoulder      Shoulder Exercises: ROM/Strengthening   UBE (Upper Arm Bike)  level 2 x 4 minutes    Lat Pull  2.5 plate;20 reps               PT Short Term Goals - 06/20/19 1048      PT SHORT TERM GOAL #1   Title  independent with initial HEP    Time  2    Period  Weeks    Status  New        PT Long Term Goals - 06/20/19 1048      PT LONG TERM GOAL #1   Title  understand posture and body mechanics    Time  8    Period  Weeks    Status  New      PT LONG TERM GOAL #2   Title  increse IR of the left shoulder to 70 degrees    Time  8    Period  Weeks    Status  New      PT LONG TERM GOAL #3   Title  increase left shoulder flexion to 160 degrees    Time  8    Period  Weeks    Status  New      PT LONG TERM GOAL #4   Title  report pain decreaesd 50% with sleeping    Time  8    Period  Weeks    Status  New             Plan - 06/20/19 1046    Clinical Impression Statement  Patient reports about 18 months of left shoulder pain and a loss of ROM, he does not recall any injury, reports that he is diabetic, He has had a cortisone injection in the past that helped some.  He reports that he has difficulty reaching out, behind the back and sleeping on the left side, pain is up to 10/10.  He has rounded shoulders that may cause some impingement    Stability/Clinical Decision Making  Stable/Uncomplicated    Clinical Decision Making  Low    Rehab Potential  Good    PT Frequency  1x / week    PT Duration  8 weeks    PT  Treatment/Interventions  ADLs/Self Care Home Management;Electrical Stimulation;Iontophoresis 4mg /ml Dexamethasone;Moist Heat;Ultrasound;Therapeutic exercise;Patient/family education;Manual techniques    PT Next Visit Plan  start gym exercises and work on scapular strength and shoulder flexibility    Consulted and Agree with Plan of Care  Patient       Patient will benefit from skilled therapeutic intervention in order to improve the following deficits and impairments:  Decreased range of motion, Increased muscle spasms, Pain, Postural dysfunction, Improper body mechanics, Decreased strength, Impaired flexibility  Visit Diagnosis: Stiffness of left shoulder, not elsewhere classified - Plan: PT plan of care cert/re-cert  Acute pain of left shoulder - Plan: PT plan of care cert/re-cert     Problem List Patient Active Problem List   Diagnosis Date Noted  . Hyperlipidemia 03/04/2018  . Class 2 severe obesity with serious comorbidity and body mass index (BMI) of 38.0 to 38.9 in adult ([place]) 03/04/2018  . Diabetic peripheral neuropathy associated with type 1 diabetes mellitus ([place]) 03/04/2018  . Classic migraine 08/05/2016  . DM type 1 with diabetic peripheral neuropathy ([place]) 06/27/2015    Sumner Boast., PT 06/20/2019, 10:51 AM  [address] [place], [place], [address] Phone: [phone]   Fax:  [phone]  Name: Kenneth [name] MRN: [id number] Date of Birth: [birth year]

## 2019-06-26 ENCOUNTER — Encounter: Payer: Self-pay | Admitting: Physical Therapy

## 2019-06-26 ENCOUNTER — Other Ambulatory Visit: Payer: Self-pay

## 2019-06-26 ENCOUNTER — Ambulatory Visit: Payer: 59 | Admitting: Physical Therapy

## 2019-06-26 DIAGNOSIS — M25512 Pain in left shoulder: Secondary | ICD-10-CM

## 2019-06-26 DIAGNOSIS — M25612 Stiffness of left shoulder, not elsewhere classified: Secondary | ICD-10-CM

## 2019-06-26 NOTE — Therapy (Signed)
[facility] [address] [place], [place], [address] Phone: [phone]   Fax:  [phone]  Physical Therapy Treatment  Patient Details  Name: Kenneth [name] MRN: [id number] Date of Birth: 1975-04-19 Referring Provider (PT): Meridee Score   Encounter Date: 06/26/2019  PT End of Session - 06/26/19 0928    Visit Number  2    Date for PT Re-Evaluation  08/20/19    PT Start Time  0845    PT Stop Time  0928    PT Time Calculation (min)  43 min    Activity Tolerance  Patient tolerated treatment well    Behavior During Therapy  [facility] for tasks assessed/performed       Past Medical History:  Diagnosis Date  . Classic migraine 08/05/2016  . Depression, major, single episode   . Diabetes mellitus without complication (HCC)   . H/O cardiac catheterization    findings were normal  . Hypertension   . Injury of hip, left September 2015  . Sleep apnea     Past Surgical History:  Procedure Laterality Date  . CORONARY ARTERY BYPASS GRAFT  Around 2011 or 2012  . VASECTOMY    . WISDOM TOOTH EXTRACTION     x8    There were no vitals filed for this visit.  Subjective Assessment - 06/26/19 0846    Subjective  Some soreness after last session. At rest he reports having no pain    Currently in Pain?  No/denies                       [facility] Adult PT Treatment/Exercise - 06/26/19 0001      Shoulder Exercises: Standing   External Rotation  Theraband;20 reps;Left    Theraband Level (Shoulder External Rotation)  Level 1 (Yellow)    Internal Rotation  Strengthening;Left;20 reps;Theraband    Theraband Level (Shoulder Internal Rotation)  Level 1 (Yellow);Level 4 (Blue)    Flexion  Both;20 reps;Strengthening    Shoulder Flexion Weight (lbs)  2    ABduction  20 reps;Both;Strengthening    Shoulder ABduction Weight (lbs)  2    Extension  20 reps;Both;Weights;Strengthening    Extension Weight (lbs)  5      Shoulder  Exercises: ROM/Strengthening   UBE (Upper Arm Bike)  level 2 x 6 minutes    Lat Pull  2.5 plate;20 reps    Cybex Row  2.5 plate;20 reps      Manual Therapy   Manual Therapy  Joint mobilization;Soft tissue mobilization;Passive ROM    Manual therapy comments  end range tightness    Joint Mobilization  GH grases 2-3 for all directions    Soft tissue mobilization  L deltoid    Passive ROM  L shoulder all directions               PT Short Term Goals - 06/20/19 1048      PT SHORT TERM GOAL #1   Title  independent with initial HEP    Time  2    Period  Weeks    Status  New        PT Long Term Goals - 06/20/19 1048      PT LONG TERM GOAL #1   Title  understand posture and body mechanics    Time  8    Period  Weeks    Status  New      PT LONG TERM GOAL #  2   Title  increse IR of the left shoulder to 70 degrees    Time  8    Period  Weeks    Status  New      PT LONG TERM GOAL #3   Title  increase left shoulder flexion to 160 degrees    Time  8    Period  Weeks    Status  New      PT LONG TERM GOAL #4   Title  report pain decreaesd 50% with sleeping    Time  8    Period  Weeks    Status  New            Plan - 06/26/19 5701    Clinical Impression Statement  Pt tolerated an initial progression to TE well. Postural cues required with seated rows. Tactile cues given to L arm for proper positioning with internal and external rotation. Very little shoulder elevation noted with flexion and abduction. Some end range L shoulder tightness noted with PROM that improved after JT mobs.    Stability/Clinical Decision Making  Stable/Uncomplicated    Rehab Potential  Good    PT Frequency  1x / week    PT Duration  8 weeks    PT Treatment/Interventions  ADLs/Self Care Home Management;Electrical Stimulation;Iontophoresis 4mg /ml Dexamethasone;Moist Heat;Ultrasound;Therapeutic exercise;Patient/family education;Manual techniques    PT Next Visit Plan  gym exercises and work on  scapular strength and shoulder flexibility       Patient will benefit from skilled therapeutic intervention in order to improve the following deficits and impairments:  Decreased range of motion, Increased muscle spasms, Pain, Postural dysfunction, Improper body mechanics, Decreased strength, Impaired flexibility  Visit Diagnosis: Stiffness of left shoulder, not elsewhere classified  Acute pain of left shoulder     Problem List Patient Active Problem List   Diagnosis Date Noted  . Hyperlipidemia 03/04/2018  . Class 2 severe obesity with serious comorbidity and body mass index (BMI) of 38.0 to 38.9 in adult ([place]) 03/04/2018  . Diabetic peripheral neuropathy associated with type 1 diabetes mellitus ([place]) 03/04/2018  . Classic migraine 08/05/2016  . DM type 1 with diabetic peripheral neuropathy ([place]) 06/27/2015    Scot Jun, PTA 06/26/2019, 9:32 AM  [address] [place], [place], [address] Phone: [phone]   Fax:  [phone]  Name: AMANDO CHAPUT MRN: [id number] Date of Birth: Apr 30, 1975

## 2019-06-28 ENCOUNTER — Encounter: Payer: 59 | Admitting: Physical Therapy

## 2019-07-04 ENCOUNTER — Encounter: Payer: Self-pay | Admitting: Physical Therapy

## 2019-07-04 ENCOUNTER — Ambulatory Visit: Payer: 59 | Admitting: Physical Therapy

## 2019-07-04 ENCOUNTER — Other Ambulatory Visit: Payer: Self-pay

## 2019-07-04 DIAGNOSIS — M25612 Stiffness of left shoulder, not elsewhere classified: Secondary | ICD-10-CM

## 2019-07-04 DIAGNOSIS — M25512 Pain in left shoulder: Secondary | ICD-10-CM

## 2019-07-04 NOTE — Therapy (Signed)
[facility] [address] [place], [place], [address] Phone: [phone]   Fax:  [phone]  Physical Therapy Treatment  Patient Details  Name: Kenneth [name] MRN: [id number] Date of Birth: 05-Aug-1975 Referring Provider (PT): Meridee Score   Encounter Date: 07/04/2019  PT End of Session - 07/04/19 0842    Visit Number  3    Number of Visits  23    Date for PT Re-Evaluation  08/20/19    PT Start Time  0800    PT Stop Time  0845    PT Time Calculation (min)  45 min    Activity Tolerance  Patient tolerated treatment well    Behavior During Therapy  [facility] for tasks assessed/performed       Past Medical History:  Diagnosis Date  . Classic migraine 08/05/2016  . Depression, major, single episode   . Diabetes mellitus without complication (HCC)   . H/O cardiac catheterization    findings were normal  . Hypertension   . Injury of hip, left September 2015  . Sleep apnea     Past Surgical History:  Procedure Laterality Date  . CORONARY ARTERY BYPASS GRAFT  Around 2011 or 2012  . VASECTOMY    . WISDOM TOOTH EXTRACTION     x8    There were no vitals filed for this visit.  Subjective Assessment - 07/04/19 0758    Subjective  "Wednesday I started to feel it, been feeling it everyday every sense."    Currently in Pain?  Yes    Pain Score  2     Pain Location  Shoulder    Pain Orientation  Left         [facility] PT Assessment - 07/04/19 0001      AROM   Left Shoulder Flexion  137 Degrees    Left Shoulder ABduction  115 Degrees    Left Shoulder Internal Rotation  32 Degrees    Left Shoulder External Rotation  62 Degrees                    [facility] Adult PT Treatment/Exercise - 07/04/19 0001      Shoulder Exercises: Standing   External Rotation  Theraband;20 reps;Left    Theraband Level (Shoulder External Rotation)  Level 3 (Green)    Internal Rotation  Strengthening;Left;20 reps;Theraband    Theraband Level (Shoulder Internal Rotation)  Level 2 (Red)    Flexion  Both;20 reps;Strengthening    Shoulder Flexion Weight (lbs)  2    ABduction  20 reps;Both;Strengthening    Shoulder ABduction Weight (lbs)  2    Extension  20 reps;Both;Weights;Strengthening    Extension Weight (lbs)  5      Shoulder Exercises: ROM/Strengthening   UBE (Upper Arm Bike)  level 3 x 6 minutes    Lat Pull  2.5 plate;20 reps    Cybex Row  2.5 plate;20 reps      Manual Therapy   Manual Therapy  Joint mobilization;Soft tissue mobilization;Passive ROM    Manual therapy comments  end range tightness    Joint Mobilization  GH grases 2-3 for all directions    Soft tissue mobilization  L deltoid    Passive ROM  L shoulder all directions               PT Short Term Goals - 07/04/19 0849      PT SHORT TERM GOAL #1   Title  independent  with initial HEP    Status  Achieved        PT Long Term Goals - 06/20/19 1048      PT LONG TERM GOAL #1   Title  understand posture and body mechanics    Time  8    Period  Weeks    Status  New      PT LONG TERM GOAL #2   Title  increse IR of the left shoulder to 70 degrees    Time  8    Period  Weeks    Status  New      PT LONG TERM GOAL #3   Title  increase left shoulder flexion to 160 degrees    Time  8    Period  Weeks    Status  New      PT LONG TERM GOAL #4   Title  report pain decreaesd 50% with sleeping    Time  8    Period  Weeks    Status  New            Plan - 07/04/19 8786    Clinical Impression Statement  Pt has a little improvement in L shoulder AROM in all directions. Tactile cues to keep L arm to his side needed with external and internal rotation. No pain reported with the exercises, but did have some pain at end range of PROM. Passive ER/IR is very tight noted with PROM    Stability/Clinical Decision Making  Stable/Uncomplicated    Rehab Potential  Good    PT Frequency  1x / week    PT Duration  8 weeks    PT  Treatment/Interventions  ADLs/Self Care Home Management;Electrical Stimulation;Iontophoresis 4mg /ml Dexamethasone;Moist Heat;Ultrasound;Therapeutic exercise;Patient/family education;Manual techniques    PT Next Visit Plan  gym exercises and work on scapular strength and shoulder flexibility       Patient will benefit from skilled therapeutic intervention in order to improve the following deficits and impairments:  Decreased range of motion, Increased muscle spasms, Pain, Postural dysfunction, Improper body mechanics, Decreased strength, Impaired flexibility  Visit Diagnosis: Stiffness of left shoulder, not elsewhere classified  Acute pain of left shoulder     Problem List Patient Active Problem List   Diagnosis Date Noted  . Hyperlipidemia 03/04/2018  . Class 2 severe obesity with serious comorbidity and body mass index (BMI) of 38.0 to 38.9 in adult ([place]) 03/04/2018  . Diabetic peripheral neuropathy associated with type 1 diabetes mellitus ([place]) 03/04/2018  . Classic migraine 08/05/2016  . DM type 1 with diabetic peripheral neuropathy ([place]) 06/27/2015    Scot Jun, PTA 07/04/2019, 8:50 AM  [facility] [place], [place], [address] Phone: [phone]   Fax:  [phone]  Name: Kenneth [name] MRN: [id number] Date of Birth: 1975/09/12

## 2019-07-07 ENCOUNTER — Encounter: Payer: 59 | Admitting: Physical Therapy

## 2019-07-11 ENCOUNTER — Ambulatory Visit: Payer: 59 | Admitting: Physical Therapy

## 2019-07-13 ENCOUNTER — Encounter: Payer: 59 | Admitting: Physical Therapy

## 2019-07-18 ENCOUNTER — Ambulatory Visit: Payer: 59 | Attending: Family Medicine | Admitting: Physical Therapy

## 2019-07-27 ENCOUNTER — Other Ambulatory Visit: Payer: Self-pay

## 2019-07-27 ENCOUNTER — Other Ambulatory Visit: Payer: Self-pay | Admitting: Internal Medicine

## 2019-07-27 MED ORDER — PREGABALIN 50 MG PO CAPS: 50.0000 mg | ORAL_CAPSULE | Freq: Every day | ORAL | 1 refills | Status: DC

## 2019-10-06 ENCOUNTER — Encounter: Payer: Self-pay | Admitting: Internal Medicine

## 2019-10-06 ENCOUNTER — Ambulatory Visit: Payer: 59 | Admitting: Internal Medicine

## 2019-10-06 ENCOUNTER — Other Ambulatory Visit: Payer: Self-pay

## 2019-10-06 VITALS — BP 130/88 | HR 84 | Ht 69.0 in | Wt 282.0 lb

## 2019-10-06 DIAGNOSIS — E1042 Type 1 diabetes mellitus with diabetic polyneuropathy: Secondary | ICD-10-CM

## 2019-10-06 DIAGNOSIS — E785 Hyperlipidemia, unspecified: Secondary | ICD-10-CM

## 2019-10-06 LAB — COMPREHENSIVE METABOLIC PANEL
ALT: 25 U/L (ref 0–53)
AST: 18 U/L (ref 0–37)
Albumin: 4.2 g/dL (ref 3.5–5.2)
Alkaline Phosphatase: 78 U/L (ref 39–117)
BUN: 17 mg/dL (ref 6–23)
CO2: 26 mEq/L (ref 19–32)
Calcium: 9.2 mg/dL (ref 8.4–10.5)
Chloride: 108 mEq/L (ref 96–112)
Creatinine, Ser: 0.99 mg/dL (ref 0.40–1.50)
GFR: 81.75 mL/min (ref >60.00)
Glucose, Bld: 117 mg/dL — ABNORMAL HIGH (ref 70–99)
Potassium: 4.3 mEq/L (ref 3.5–5.1)
Sodium: 142 mEq/L (ref 135–145)
Total Bilirubin: 0.5 mg/dL (ref 0.2–1.2)
Total Protein: 6.9 g/dL (ref 6.0–8.3)

## 2019-10-06 LAB — LIPID PANEL
Cholesterol: 152 mg/dL (ref 0–200)
HDL: 46.1 mg/dL (ref >39.00)
LDL Cholesterol: 89 mg/dL (ref 0–99)
NonHDL: 106.09
Total CHOL/HDL Ratio: 3
Triglycerides: 87 mg/dL (ref 0.0–149.0)
VLDL: 17.4 mg/dL (ref 0.0–40.0)

## 2019-10-06 LAB — MICROALBUMIN / CREATININE URINE RATIO
Creatinine,U: 118.1 mg/dL
Microalb Creat Ratio: 0.6 mg/g (ref 0.0–30.0)
Microalb, Ur: <0.7 mg/dL (ref 0.0–1.9)

## 2019-10-06 LAB — POCT GLYCOSYLATED HEMOGLOBIN (HGB A1C): Hemoglobin A1C: 8.3 % — AB (ref 4.0–5.6)

## 2019-10-06 LAB — TSH: TSH: 4.36 u[IU]/mL (ref 0.35–4.50)

## 2019-10-06 NOTE — Progress Notes (Signed)
Patient ID: Kenneth [name], male   DOB: 21-Mar-1975, 44 y.o.   MRN: [id number]  This visit occurred during the SARS-CoV-2 public health emergency.  Safety protocols were in place, including screening questions prior to the visit, additional usage of staff PPE, and extensive cleaning of exam room while observing appropriate contact time as indicated for disinfecting solutions.   HPI: Kenneth [name] is a 44 y.o.-year-old male, initially referred by his PCP, Dr. Radene Ou, returning for follow-up: DM1, dx 48 (age 85), uncontrolled, with complications ([place]). He previously saw Dr. Meredith Pel and Dr. Hartford Poli. Last visit with me 4.5 months ago.  He had trigger finger Sx 08/25/2019 >> still swelling >> will start PT.  Reviewed HbA1c levels: Lab Results  Component Value Date   HGBA1C 8.3 (A) 05/18/2019   HGBA1C 7.7 (A) 03/04/2018   HGBA1C 7.8 (A) 10/29/2017   Insulin pump: -She was on a Medtronic 670 G since 01/2016 - WNIOEVOJJ 009 G MiniMed-started 12/2018 -he likes this pump much better  CGM: -Started on the Guardian CGM since 02/2016  Insulin: -Fiasp >> now Humalog  Supplies:  - [employer]  Pump settings (changes made at last visit are shown in bold): - basal rates: 12 am: 1.8 8 am: 1.8 >> 1.85 4 pm: 1.8 9 pm: 1.75 - ICR:  12 am: 4 6 am: 3.5 11:30 am: 3.5 6 pm: 4 >> 3.5 - target: 110-120  - ISF: 12 am: 40 - Insulin on Board: 3h TDD from basal insulin: 62% (57 units) >> 45% (62 units) >> 50% >> 47% TDD from bolus insulin: 38% (35 units) >> 55% (76 units) >> 50% >> 53%  Total daily dose: 100- 150 units.  - changes infusion site: Every 2-3 days - Meter: Bayer Contour >> Accu-Chek guide  We tried metformin ER 500 mg twice daily >> nausea >> stopped.  Pt checks his sugars by fingerstick 2.1 times a day and calibrates the sensor 1.9 times a day.  CGM alarms are: 65 and 250  CGM parameters: - Average from CGM: 164+/-57  >> 187+/-58 in the last 2 weeks - Average from manual BG  checks: 256+/-61 >> 240+/-65 in the last 2 weeks  Time in range:  - very low (40-50):   0% >> 0% >> 0% >> 0% >> 0% >> 0% - low (50-70): 1% >> 0% >> 0% >> 1% >> 0% >> 1% - normal range (70-180):  52% >> 53% >> 30% >> 48% >> 60% >> 47% - high sugars (180-250): 35% >> 32% >> 44% >> 31% >> 24% >> 41% - very high sugars (250-400):12% >> 15% >> 20% >> 8% >> 11%  - in auto mode: 28% >> 59% >> 61% >> 59% - in manual mode:  72% >> 31% >> 39% >> 41%    At last visit:  In 02/2019:   2 weeks prior:   Lowest sugar was 39 (miscalculated carbs the night before) >> .Marland Kitchen.  40s >> <40 (CGM);  He has hypoglycemia awareness in the 70s.  No previous hypoglycemia admissions.  He has a glucagon kit at home. 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  Highest sugar was 300s >> 350 (off the CGM) >> 300s >> ; no previous DKA admissions.  Pt's meals are: - Breakfast: snack cake, yoghurt - Lunch: sandwich, frozen foods - Dinner: pizza, tacos, steak, veggies, hot dogs - Snacks: chips, ice cream, yoghurt, fruit, 3-4x a day   -No CKD, last BUN/creatinine:  10/21/2018: 14/1.01, GFR 80 Lab Results  Component Value Date   BUN 21 06/25/2017   CREATININE 1.03 06/25/2017   He has a history of microalbuminuria but his most recent ACR's have been normal: Lab Results  Component Value Date   MICRALBCREAT 0.5 06/25/2017   MICRALBCREAT 0.9 03/13/2016  07/27/2014: ACR 3.2.  On lisinopril.  -+ HL; last set of lipids: 10/21/2018: 135/85/47/72 Lab Results  Component Value Date   CHOL 115 06/25/2017   HDL 43.90 06/25/2017   LDLCALC 60 06/25/2017   TRIG 54.0 06/25/2017   CHOLHDL 3 06/25/2017  On Lipitor.  - last eye exam was in 08/2019: No DR reportedly.  He had bilateral cataracts, unclear if still seen on last exam.  -+ Numbness and tingling in his feet.  He continues on Lyrica, which is helping.  He tried alpha-lipoic acid and B complex but his symptoms got worse so he stopped.     Latest TSH: Lab Results  Component Value Date   TSH 3.55 06/25/2017   ROS: Constitutional: no weight gain/no weight loss, no fatigue, no subjective hyperthermia, no subjective hypothermia Eyes: no blurry vision, no xerophthalmia ENT: no sore throat, no nodules palpated in neck, no dysphagia, no odynophagia, no hoarseness Cardiovascular: no CP/no SOB/no palpitations/no leg swelling Respiratory: no cough/no SOB/no wheezing Gastrointestinal: no N/no V/no D/no C/no acid reflux Musculoskeletal: no muscle aches/no joint aches Skin: no rashes, no hair loss Neurological: no tremors/+ numbness/+ tingling/no dizziness  I reviewed pt's medications, allergies, PMH, social hx, family hx, and changes were documented in the history of present illness. Otherwise, unchanged from my initial visit note.   Past Medical History:  Diagnosis Date  . Classic migraine 08/05/2016  . Depression, major, single episode   . Diabetes mellitus without complication ([place])   . H/O cardiac catheterization    findings were normal  . Hypertension   . Injury of hip, left September 2015  . Sleep apnea    Past Surgical History:  Procedure Laterality Date  . CORONARY ARTERY BYPASS GRAFT  Around 2011 or 2012  . VASECTOMY    . WISDOM TOOTH EXTRACTION     x8   Social History   Social History  . Marital Status: Married    Spouse Name: N/A  . Number of Children: 3   Occupational History  . student   Social History Main Topics  . Smoking status: Never Smoker   . Smokeless tobacco: Never Used  . Alcohol Use: No  . Drug Use: No   Current Outpatient Medications on File Prior to Visit  Medication Sig Dispense Refill  . aspirin 81 MG EC tablet Take by mouth.    Marland Kitchen atorvastatin (LIPITOR) 20 MG tablet   2  . cetirizine (ZYRTEC) 10 MG tablet Take 10 mg by mouth daily.    . cholecalciferol (VITAMIN D) 1000 units tablet Take 2,000 Units by mouth daily.    Marland Kitchen glucagon (GLUCAGON EMERGENCY) 1 MG injection Inject 1 mg  into the muscle once as needed. 1 each prn  . glucose blood (ACCU-CHEK GUIDE) test strip To use with insulin pump. Check blood sugar up to 7 times daily 700 each 12  . insulin lispro (HUMALOG) 100 UNIT/ML injection INJECT SUBCUTANEOUSLY UP TO 100 UNITS A DAY VIA INSULIN PUMP 90 mL 3  . lisinopril (PRINIVIL,ZESTRIL) 5 MG tablet Take by mouth.    . pantoprazole (PROTONIX) 40 MG tablet Take 40 mg by mouth daily.    . pregabalin (LYRICA) 50 MG capsule Take  1 capsule (50 mg total) by mouth daily. 90 capsule 1  . topiramate (TOPAMAX) 50 MG tablet TAKE 1 TABLET BY MOUTH AT  BEDTIME 90 tablet 3   No current facility-administered medications on file prior to visit.   Allergies  Allergen Reactions  . Dilantin [Phenytoin] Other (See Comments)    Hallucinations  . Gabapentin Other (See Comments)    Causes hallucinations   Family History  Problem Relation Age of Onset  . Arthritis Mother   . Leukemia Mother   . Diabetes Father   . Heart disease Father   . Cancer Father   . Migraines Father    PE: BP 130/88   Pulse 84   Ht _0  (1.753 m)   Wt 282 lb (127.9 kg)   SpO2 97%   BMI 41.64 kg/m  Wt Readings from Last 3 Encounters:  10/06/19 282 lb (127.9 kg)  05/18/19 281 lb (127.5 kg)  03/04/18 262 lb (118.8 kg)   Constitutional: overweight, in NAD Eyes: PERRLA, EOMI, no exophthalmos ENT: moist mucous membranes, no thyromegaly, no cervical lymphadenopathy Cardiovascular: RRR, No MRG Respiratory: CTA B Gastrointestinal: abdomen soft, NT, ND, BS+ Musculoskeletal: no deformities, strength intact in all 4; + left hand swelling; trigger finger surgical scar healing, but still erythematous Skin: moist, warm, no rashes Neurological: no tremor with outstretched hands, DTR normal in all 4  ASSESSMENT: 1. DM1, uncontrolled, with complications - PN  54/00/8676: C-peptide<0.1 (1.1-4.4 ng/mL), Glucose 176   2. HL  3. Peripheral neuropathy  PLAN:  1. Patient with history of longstanding,  uncontrolled, type 1 diabetes, on the Medtronic 770 G insulin pump, with integrated CGM.  Before last visit he upgraded his insulin pump and he feels that this model is working better for him. -He is doing a good job introducing carbs into the pump most of the time, however, he had several days in the last 2 weeks in which he could not do so consistently.  Also, he was in the manual mode and did not to bolus before meals.  Sugars were higher during this interval. -He is still not calibrating the sensor 4 times a day, but only approximately twice a day.  I again advised him to try to aim for 4 times a day especially since he is frequently kicked out of the automatic mode due to no calibration. -He does a good job changing infusion site every 3 days -Before last visit, he gained 20 pounds so we strengthened his insulin to carb ratio with dinner.  At that time, he was very busy at work and not having consistent mealtimes.  I advised him to try to space his meals 4 to 5 hours apart.  We also discussed about strengthening his insulin to carb ratios with steroid injections to 1:3 CGM interpretation: -We reviewed his CGM tracings at this visit: The overall profile for the last 2 weeks appears to be very similar to the one obtained at last visit.  His sugars are high overnight but they do increase around 9 AM they remain elevated up until approximately 2 PM.  He has another mild hyperglycemic spike between 3:30 AM to 5 PM.  Also, there is another mild spike after 7 PM.  However, reviewing individual traces, it appears that his sugars are much higher and more fluctuating when he is not using the sensor.  When he is using the sensor, the sugars fluctuate in a much narrower range.  Overall, however, sugars are higher after he wakes up  and he mentions that they start increasing around 6 AM, even if he does not eat or drink any coffee at that time.  They remain higher than target throughout the day.  Therefore, I believe,  that she would benefit from higher basal rates throughout the day, especially since he did not lose any weight since last visit so his insulin resistance is still high.  Also, he is higher peaks appear to be when he misses his bolus before a certain meal.  I strongly advised him not to miss any doses.  He occasionally boluses too late, after sugars already started to eat (for example, see the tracings for Friday, 09/29/2019 in the downloaded report which will be scanned in the chart).  We discussed about the absolute importance of bolusing before every single meal.  Since he is still having problems with daily 15 minutes before he actually boluses for the meal, I suggested to change from Humalog to a faster onset lispro, Lyumjev.  However, he just refilled his prescription for Humalog so we will need to wait for now. -Patient also had some low blood sugars in the middle of the night, but these were rare. - I suggested to:  Patient Instructions   Please continue: - basal rates: 12 am: 1.8 5:30 am: 1.8 >> 1.9 8 am: 1.85 >> 1.95 4 pm: 1.8 >> 1.85 9 pm: 1.75 - ICR:  12 am: 4 6 am: 3.5 11:30 am: 3.5 6 pm: 3.5 - target: 110-120  - ISF: 12 am: 40 - Active insulin time: 3h  Please let me know if you run out of Humalog, to switch to Lyumjev.  Please return in 3-4 months.  - we checked his HbA1c: 8.3% (stable) - advised to check sugars at different times of the day - 4x a day, rotating check times - advised for yearly eye exams >> he is UTD - return to clinic in 3-4 months  2. HL -Reviewed latest lipid panel from 10/2018: LDL was higher, but the goal and the rest of the fractions are also at goal -Continues Lipitor without side effects -We will check another lipid panel today  3.  Peripheral neuropathy -Stable, no new complaints -He tried alpha-lipoic acid and B complex but felt his symptoms were worse on these.  He now continues on Lyrica.  We will check annual labs today.  - time  spent for the visit: 40 min, in reviewing his pump downloads, discussing his hypo- and hyper-glycemic episodes, reviewing previous labs and pump settings and developing a plan to avoid hypo- and hyper-glycemia.  We also addressed the rest of his endocrine problems.  Component     Latest Ref Rng & Units 10/06/2019  Sodium     135 - 145 mEq/L 142  Potassium     3.5 - 5.1 mEq/L 4.3  Chloride     96 - 112 mEq/L 108  CO2     19 - 32 mEq/L 26  Glucose     70 - 99 mg/dL 117 (H)  BUN     6 - 23 mg/dL 17  Creatinine     0.40 - 1.50 mg/dL 0.99  Total Bilirubin     0.2 - 1.2 mg/dL 0.5  Alkaline Phosphatase     39 - 117 U/L 78  AST     0 - 37 U/L 18  ALT     0 - 53 U/L 25  Total Protein     6.0 - 8.3 g/dL 6.9  Albumin  3.5 - 5.2 g/dL 4.2  GFR     >60.00 mL/min 81.75  Calcium     8.4 - 10.5 mg/dL 9.2  Cholesterol     0 - 200 mg/dL 152  Triglycerides     0 - 149 mg/dL 87.0  HDL Cholesterol     >39.00 mg/dL 46.10  VLDL     0.0 - 40.0 mg/dL 17.4  LDL (calc)     0 - 99 mg/dL 89  Total CHOL/HDL Ratio      3  NonHDL      106.09  Microalb, Ur     0.0 - 1.9 mg/dL <0.7  Creatinine,U     mg/dL 118.1  MICROALB/CREAT RATIO     0.0 - 30.0 mg/g 0.6  TSH     0.35 - 4.50 uIU/mL 4.36   Philemon Kingdom, MD PhD [facility] Endocrinology

## 2019-10-06 NOTE — Patient Instructions (Addendum)
°  Please continue: - basal rates: 12 am: 1.8 5:30 am: 1.8 >> 1.9 8 am: 1.85 >> 1.95 4 pm: 1.8 >> 1.85 9 pm: 1.75 - ICR:  12 am: 4 6 am: 3.5 11:30 am: 3.5 6 pm: 3.5 - target: 110-120  - ISF: 12 am: 40 - Active insulin time: 3h  Please let me know if you run out of Humalog, to switch to Lyumjev.  Please return in 3-4 months.

## 2019-12-08 ENCOUNTER — Telehealth: Payer: Self-pay | Admitting: Internal Medicine

## 2019-12-08 NOTE — Telephone Encounter (Signed)
V[place] with [facility] requests to be called at ph# [phone] re: RX that was sent electronically on 07/27/19 for Lyrica. Vale Haven needs a verbal authorization.

## 2019-12-08 NOTE — Telephone Encounter (Signed)
Okay to refill Lyrica?  Just wanted to check. Thanks!

## 2019-12-08 NOTE — Telephone Encounter (Signed)
Called pharmacy.  Gave verbal order-  Pharmacy verbalized refill.

## 2019-12-08 NOTE — Telephone Encounter (Signed)
OK to refill

## 2019-12-12 ENCOUNTER — Other Ambulatory Visit: Payer: Self-pay

## 2019-12-12 ENCOUNTER — Telehealth: Payer: Self-pay | Admitting: Internal Medicine

## 2019-12-12 MED ORDER — INSULIN LISPRO 100 UNIT/ML ~~LOC~~ SOLN: SUBCUTANEOUS | 3 refills | Status: DC

## 2019-12-12 NOTE — Telephone Encounter (Signed)
Medication Refill Request  Did you call your pharmacy and request this refill first? Yes  . If patient has not contacted pharmacy first, instruct them to do so for future refills.  . Remind them that contacting the pharmacy for their refill is the quickest method to get the refill.  . Refill policy also stated that it will take anywhere between 24-72 hours to receive the refill.    Name of medication? GENERIC humalog  Is this a 90 day supply? yes  Name and location of pharmacy?   [facility]  [address] [place], [place] [address] Phone: [phone]

## 2019-12-12 NOTE — Telephone Encounter (Signed)
RX sent to pharmacy  

## 2020-01-19 ENCOUNTER — Ambulatory Visit: Payer: 59 | Admitting: Internal Medicine

## 2020-02-23 ENCOUNTER — Telehealth: Payer: Self-pay | Admitting: Internal Medicine

## 2020-02-23 ENCOUNTER — Other Ambulatory Visit: Payer: Self-pay | Admitting: Internal Medicine

## 2020-02-23 MED ORDER — PREGABALIN 50 MG PO CAPS
50.0000 mg | ORAL_CAPSULE | Freq: Every day | ORAL | 1 refills | Status: DC
Start: 2020-02-23 — End: 2020-11-21

## 2020-02-23 NOTE — Telephone Encounter (Signed)
Oh, sure! I sent it.

## 2020-02-23 NOTE — Telephone Encounter (Signed)
Patient has requested a refill on Humalog and Lyrica - Pharmacy is the [facility] on [address] [place]  Call back number [phone]

## 2020-02-23 NOTE — Telephone Encounter (Signed)
Lyrica is a controlled substance and I can not send it electronically

## 2020-03-05 ENCOUNTER — Encounter: Payer: Self-pay | Admitting: Internal Medicine

## 2020-03-05 ENCOUNTER — Ambulatory Visit: Payer: 59 | Admitting: Internal Medicine

## 2020-03-05 ENCOUNTER — Other Ambulatory Visit: Payer: Self-pay

## 2020-03-05 VITALS — BP 128/86 | HR 103 | Ht 69.0 in | Wt 269.1 lb

## 2020-03-05 DIAGNOSIS — E785 Hyperlipidemia, unspecified: Secondary | ICD-10-CM | POA: Diagnosis not present

## 2020-03-05 DIAGNOSIS — E1042 Type 1 diabetes mellitus with diabetic polyneuropathy: Secondary | ICD-10-CM

## 2020-03-05 DIAGNOSIS — E1065 Type 1 diabetes mellitus with hyperglycemia: Secondary | ICD-10-CM | POA: Diagnosis not present

## 2020-03-05 LAB — POCT GLYCOSYLATED HEMOGLOBIN (HGB A1C): Hemoglobin A1C: 9 % — AB (ref 4.0–5.6)

## 2020-03-05 NOTE — Patient Instructions (Addendum)
  Pump Settings: - basal rates: 12 am: 1.8 5:30 am: 1.9 8 am: 2.1 4 pm: 1.85 9 pm: 1.75 - ICR:  12 am: 4 6 am: 3.0 11:30 am: 3.0 6 pm: 3.5 - target: 110-120  - ISF: 12 am: 35 - Active insulin time: 3h

## 2020-03-05 NOTE — Progress Notes (Signed)
Name: Kenneth [name]  Age/ Sex: 45 y.o., male   MRN/ DOB: [id number], 11-02-1975     PCP: Clayborn Heron, MD   Reason for Endocrinology Evaluation: Type 1 Diabetes Mellitus  Initial Endocrine Consultative Visit: 06/27/2015    PATIENT IDENTIFIER: Kenneth [name] is a 45 y.o. male with a past medical history of T1DM, HTN and OSA. The patient has followed with Endocrinology clinic since 06/27/2015 for consultative assistance with management of his diabetes.  DIABETIC HISTORY:  Kenneth [name] was diagnosed with T1DM in 1997 at the age of 71, he was started on insulin at diagnosis. Has been on Medtronic 670 G since 01/2016, has been on the Guardian since 02/2016.  His hemoglobin A1c has ranged from 7.2% in 2018, peaking at 8.5% in 2019.   Switched care from Kenneth [name] in  02/2020. Prior to that he has been seen by Kenneth [name] and Kenneth [name] to Metformin , Novolog does not work well for him     Paternal grandmother with T1 DM  SUBJECTIVE:   During the last visit (10/06/2019): A1c 8.3 % pump adjustments were made      Today (03/05/2020): Kenneth [name] is here for a follow up on diabetes management.  He checks his blood sugars periodically, he ran out of insurance and has not used the CGM in ~3 months. The patient has had hypoglycemic episodes since the last clinic visit, which typically occur 1 x / month, during the day- The patient is not symptomatic with these episodes   Eats 2 meals a day. Snacks after work , rarely drinks sugar sweetened beverages   Has occasional diarrhea.  But no nausea or vomiting      PUMP SETTINGS & DOWNLOAD: Report dates:    BASAL SETTINGS (MANUAL MODE ONLY): Time  Basal Rate (units/ hr)   0000 1.8  0530 1.9  0800 1.95  1600 1.85  2100 1.75  Total 24-hour basal: 44.8 units     BOLUS SETTINGS: Insulin:Carb Ratio:  0000 4 0600  3.5 1130  3.5 1800  3.5   Sensitivity (adjustable in manual mode only - calculated by algorithm in  auto mode):  40   Target BG (adjustable in manual mode only - set at 120, correction bolus to 150 then microboluses do remainder):  70-180      Active Insulin Time:  3 hrs        CURRENT PUMP STATISTICS: unable to download as the software is down    HOME DIABETES REGIMEN:  Metformin  Humalog     Statin: Yes ACE-I/ARB: Yes Prior Diabetic Education: Yes   METER DOWNLOAD SUMMARY: Did not bring   DIABETIC COMPLICATIONS: Microvascular complications:   Neuropathy  Denies: CKD , retinopathy   Last Eye Exam: Completed 2021  Macrovascular complications:    Denies: CAD, CVA, PVD   HISTORY:  Past Medical History:  Past Medical History:  Diagnosis Date  . Classic migraine 08/05/2016  . Depression, major, single episode   . Diabetes mellitus without complication (HCC)   . H/O cardiac catheterization    findings were normal  . Hypertension   . Injury of hip, left September 2015  . Sleep apnea    Past Surgical History:  Past Surgical History:  Procedure Laterality Date  . CORONARY ARTERY BYPASS GRAFT  Around 2011 or 2012  . VASECTOMY    . WISDOM TOOTH EXTRACTION     x8    Social History:  reports that he has  never smoked. He has never used smokeless tobacco. He reports that he does not drink alcohol and does not use drugs. Family History:  Family History  Problem Relation Age of Onset  . Arthritis Mother   . Leukemia Mother   . Diabetes Father   . Heart disease Father   . Cancer Father   . Migraines Father      HOME MEDICATIONS: Allergies as of 03/05/2020      Reactions   Dilantin [phenytoin] Other (See Comments)   Hallucinations   Gabapentin Other (See Comments)   Causes hallucinations      Medication List       Accurate as of March 05, 2020  1:32 PM. If you have any questions, ask your nurse or doctor.        Accu-Chek Guide test strip Generic drug: glucose blood To use with insulin pump. Check blood sugar up to 7 times daily    aspirin 81 MG EC tablet Take by mouth.   atorvastatin 20 MG tablet Commonly known as: LIPITOR   cetirizine 10 MG tablet Commonly known as: ZYRTEC Take 10 mg by mouth daily.   cholecalciferol 1000 units tablet Commonly known as: VITAMIN D Take 2,000 Units by mouth daily.   glucagon 1 MG injection Inject 1 mg into the muscle once as needed.   insulin lispro 100 UNIT/ML injection Commonly known as: HumaLOG INJECT SUBCUTANEOUSLY UP TO 100 UNITS A DAY VIA INSULIN PUMP   lisinopril 5 MG tablet Commonly known as: ZESTRIL Take by mouth.   pantoprazole 40 MG tablet Commonly known as: PROTONIX Take 40 mg by mouth daily.   pregabalin 50 MG capsule Commonly known as: LYRICA Take 1 capsule (50 mg total) by mouth daily.   topiramate 50 MG tablet Commonly known as: TOPAMAX TAKE 1 TABLET BY MOUTH AT  BEDTIME        OBJECTIVE:   Vital Signs: BP 128/86   Pulse (!) 103   Ht 5\' 9"  (1.753 m)   Wt 269 lb 2 oz (122.1 kg)   SpO2 94%   BMI 39.74 kg/m   Wt Readings from Last 3 Encounters:  03/05/20 269 lb 2 oz (122.1 kg)  10/06/19 282 lb (127.9 kg)  05/18/19 281 lb (127.5 kg)     Exam: General: Pt appears well and is in NAD  Neck: General: Supple without adenopathy. Thyroid: Thyroid size normal.  No goiter or nodules appreciated. No thyroid bruit.  Lungs: Clear with good BS bilat with no rales, rhonchi, or wheezes  Heart: RRR with normal S1 and S2 and no gallops; no murmurs; no rub  Abdomen: Normoactive bowel sounds, soft, nontender, without masses or organomegaly palpable  Extremities: Trace pretibial edema.  Skin: Normal texture and temperature to palpation. No rash noted.  Neuro: MS is good with appropriate affect, pt is alert and Ox3    DM foot exam: 03/05/2020  The skin of the feet is intact without sores or ulcerations. The pedal pulses are 2+ on right and 2+ on left. The sensation is decreased  to a screening 5.07, 10 gram monofilament bilaterally     DATA  REVIEWED:  Lab Results  Component Value Date   HGBA1C 9.0 (A) 03/05/2020   HGBA1C 8.3 (A) 10/06/2019   HGBA1C 8.3 (A) 05/18/2019   Lab Results  Component Value Date   MICROALBUR <0.7 10/06/2019   LDLCALC 89 10/06/2019   CREATININE 0.99 10/06/2019   Lab Results  Component Value Date   MICRALBCREAT 0.6 10/06/2019  Lab Results  Component Value Date   CHOL 152 10/06/2019   HDL 46.10 10/06/2019   LDLCALC 89 10/06/2019   TRIG 87.0 10/06/2019   CHOLHDL 3 10/06/2019         ASSESSMENT / PLAN / RECOMMENDATIONS:   1) Type 1 Diabetes Mellitus, Poorly controlled, With neuropathic  complications - Most recent A1c of 9.0 %. Goal A1c < 7.0 %.    - I have discussed with the patient the pathophysiology of diabetes. We went over the natural progression of the disease. We talked about both insulin resistance and insulin deficiency. We stressed the importance of lifestyle changes including diet and exercise. I explained the complications associated with diabetes including retinopathy, nephropathy, neuropathy as well as increased risk of cardiovascular disease. We went over the benefit seen with glycemic control.   - I have encouraged him to bolus with his first bite or prior to eating , and not after.  - We have not been able to download his pump due to software issue. I have manually reviewed some of the data, his BG now is 201 mg/dL , last time he ate was ~ 4 hrs ago.  - Will increased basal rate, adjust I:C ratio during the day and SF - He is intolerant to Metformin  - We briefly discussed GLP-1 agonists - will consider in the future   MEDICATIONS:  Humalog   BASAL SETTINGS (MANUAL MODE ONLY): Time  Basal Rate (units/ hr)   0000 1.8  0530 1.9  0800 2.0  1600 1.85  2100 1.75     BOLUS SETTINGS: Insulin:Carb Ratio:  0000 4 0600  3.0 1130  3.0 1800  3.5   Sensitivity (adjustable in manual mode only - calculated by algorithm in auto mode):  35   Target BG (adjustable  in manual mode only - set at 120, correction bolus to 150 then microboluses do remainder):  70-180      Active Insulin Time:  3 hrs      EDUCATION / INSTRUCTIONS:  BG monitoring instructions: Patient is instructed to check his blood sugars 4 times a day, before meals and bedtime.  Call [facility] if: BG persistently < 70  . I reviewed the Rule of 15 for the treatment of hypoglycemia in detail with the patient. Literature supplied.    2) Diabetic complications:   Eye: Does not have known diabetic retinopathy.   Neuro/ Feet: Does  have known diabetic peripheral neuropathy .   Renal: Patient does not have known baseline CKD. He   is  on an ACEI/ARB at present.       3) Dyslipidemia : Patient is on Atorvastatin , he is not sure he needs this at this time. We discussed the cardiovascular benefits of statins as well as the ADA recommendations in being on statins. I would recommend continuing this.    Medication Atorvastatin 20 mg daily    F/U in 4 months    Signed electronically by: Lyndle Herrlich, MD  [facility]  [facility] [address] [place] [place], [place] [address] Phone: [phone] FAX: [phone]   CC: Clayborn Heron, MD [address] [place] [address] Phone: [phone]  Fax: [phone]  Return to Endocrinology clinic as below: No future appointments.

## 2020-03-07 ENCOUNTER — Telehealth: Payer: Self-pay | Admitting: Family Medicine

## 2020-03-07 NOTE — Telephone Encounter (Signed)
Pt has had 2 Moderna covid vaccines. Pt plans to get the booster in [place] at same place he got the others, an [facility].

## 2020-03-13 ENCOUNTER — Other Ambulatory Visit: Payer: Self-pay | Admitting: Neurology

## 2020-03-14 ENCOUNTER — Encounter: Payer: Self-pay | Admitting: Internal Medicine

## 2020-03-14 DIAGNOSIS — E1065 Type 1 diabetes mellitus with hyperglycemia: Secondary | ICD-10-CM

## 2020-03-14 MED ORDER — ACCU-CHEK GUIDE VI STRP: ORAL_STRIP | 12 refills | Status: DC

## 2020-03-26 ENCOUNTER — Telehealth: Payer: Self-pay | Admitting: Family Medicine

## 2020-03-26 MED ORDER — ACCU-CHEK GUIDE VI STRP: ORAL_STRIP | 12 refills | Status: DC

## 2020-03-26 NOTE — Telephone Encounter (Signed)
Patient's spouse called in reference to test strips needing a prior authorization. Patient is completely out of them .  Please advise

## 2020-03-26 NOTE — Telephone Encounter (Signed)
Patient's spouse states prior authorization needs to go to [id number] - [place], [place] - [address] AT [facility] [address]  [address], [place] [address]  Phone:  [phone] Fax:  [phone]  DEA #:  [id number]

## 2020-03-26 NOTE — Telephone Encounter (Signed)
Spoken to patient and notified him that prior authorization was completed. Ask patient to call insurance and let us know.

## 2020-03-26 NOTE — Telephone Encounter (Signed)
Called [facility] and completed over the phone. Will be notified within 24-72 hours

## 2020-03-26 NOTE — Telephone Encounter (Signed)
Patient's wife Aurther Loft) called. She stated that there was no prior authorization completed. Inform that I will try to resent it and will call insurance when I have a moment.

## 2020-03-26 NOTE — Telephone Encounter (Signed)
Tried to resent prior auth and it was sent. Tried to call patient's insurance but unable to complete due to time because tried to call between seeing patients.

## 2020-03-29 NOTE — Telephone Encounter (Signed)
Insurance have denied this request.

## 2020-04-01 NOTE — Telephone Encounter (Signed)
Completed the appeals form and faxed to insurance for reconsideration.

## 2020-04-09 ENCOUNTER — Telehealth: Payer: Self-pay | Admitting: Internal Medicine

## 2020-04-09 NOTE — Telephone Encounter (Signed)
Patient is requesting for his most recent A1C results to be faxed to [phone] [facility]

## 2020-04-11 ENCOUNTER — Encounter: Payer: Self-pay | Admitting: *Deleted

## 2020-04-11 NOTE — Telephone Encounter (Signed)
Faxed as requested on 04/10/2020

## 2020-07-02 ENCOUNTER — Other Ambulatory Visit: Payer: Self-pay | Admitting: Neurology

## 2020-07-04 ENCOUNTER — Telehealth: Payer: Self-pay | Admitting: Neurology

## 2020-07-04 ENCOUNTER — Other Ambulatory Visit: Payer: Self-pay | Admitting: Emergency Medicine

## 2020-07-04 MED ORDER — TOPIRAMATE 50 MG PO TABS: 50.0000 mg | ORAL_TABLET | Freq: Every day | ORAL | 0 refills | Status: DC

## 2020-07-04 MED ORDER — TOPIRAMATE 50 MG PO TABS
50.0000 mg | ORAL_TABLET | Freq: Every day | ORAL | 0 refills | Status: DC
Start: 2020-07-04 — End: 2020-07-04

## 2020-07-04 NOTE — Telephone Encounter (Signed)
Pt called needing a refill on his topiramate (TOPAMAX) 50 MG tablet sent in to the [facility] on [address]

## 2020-07-09 ENCOUNTER — Ambulatory Visit: Payer: 59 | Admitting: Internal Medicine

## 2020-07-09 NOTE — Progress Notes (Deleted)
Name: Kenneth [name]  Age/ Sex: 45 y.o., male   MRN/ DOB: [id number], Oct 09, 1975     PCP: Clayborn Heron, MD   Reason for Endocrinology Evaluation: Type 1 Diabetes Mellitus  Initial Endocrine Consultative Visit: 06/27/2015    PATIENT IDENTIFIER: Kenneth [name] is a 45 y.o. male with a past medical history of T1DM, HTN and OSA. The patient has followed with Endocrinology clinic since 06/27/2015 for consultative assistance with management of his diabetes.  DIABETIC HISTORY:  [name] was diagnosed with T1DM in 1997 at the age of 63, he was started on insulin at diagnosis. Has been on Medtronic 670 G since 01/2016, has been on the Guardian since 02/2016.  His hemoglobin A1c has ranged from 7.2% in 2018, peaking at 8.5% in 2019.   Switched care from Dr. Elvera Lennox in  02/2020. Prior to that he has been seen by Dr. Roanna Raider and Dr. Orvan Falconer to Metformin , Novolog does not work well for him     Paternal grandmother with T1 DM  SUBJECTIVE:   During the last visit (03/05/2020): A1c 9.0 % pump adjustments were made      Today (07/09/2020): [name] is here for a follow up on diabetes management.  He checks his blood sugars periodically, he ran out of insurance and has not used the CGM in ~3 months. The patient has had hypoglycemic episodes since the last clinic visit, which typically occur 1 x / month, during the day- The patient is not symptomatic with these episodes   Eats 2 meals a day. Snacks after work , rarely drinks sugar sweetened beverages   Has occasional diarrhea.  But no nausea or vomiting      PUMP SETTINGS & DOWNLOAD: Report dates:    BASAL SETTINGS (MANUAL MODE ONLY): Time  Basal Rate (units/ hr)   0000 1.8  0530 1.9  0800 2.0   1600 1.85  2100 1.75  Total 24-hour basal: 44.8 units     BOLUS SETTINGS: Insulin:Carb Ratio:  0000 4 0600  3.0 1130  3.0 1800  3.5   Sensitivity (adjustable in manual mode only - calculated by algorithm in  auto mode):  40   Target BG (adjustable in manual mode only - set at 120, correction bolus to 150 then microboluses do remainder):  70-180      Active Insulin Time:  3 hrs         HOME DIABETES REGIMEN:  Metformin  Humalog     Statin: Yes ACE-I/ARB: Yes Prior Diabetic Education: Yes   METER DOWNLOAD SUMMARY: Did not bring   DIABETIC COMPLICATIONS: Microvascular complications:   Neuropathy  Denies: CKD , retinopathy   Last Eye Exam: Completed 2021  Macrovascular complications:    Denies: CAD, CVA, PVD   HISTORY:  Past Medical History:  Past Medical History:  Diagnosis Date  . Classic migraine 08/05/2016  . Depression, major, single episode   . Diabetes mellitus without complication (HCC)   . H/O cardiac catheterization    findings were normal  . Hypertension   . Injury of hip, left September 2015  . Sleep apnea    Past Surgical History:  Past Surgical History:  Procedure Laterality Date  . CORONARY ARTERY BYPASS GRAFT  Around 2011 or 2012  . VASECTOMY    . WISDOM TOOTH EXTRACTION     x8    Social History:  reports that he has never smoked. He has never used smokeless tobacco. He reports that he  does not drink alcohol and does not use drugs. Family History:  Family History  Problem Relation Age of Onset  . Arthritis Mother   . Leukemia Mother   . Diabetes Father   . Heart disease Father   . Cancer Father   . Migraines Father      HOME MEDICATIONS: Allergies as of 07/09/2020      Reactions   Dilantin [phenytoin] Other (See Comments)   Hallucinations   Gabapentin Other (See Comments)   Causes hallucinations      Medication List       Accurate as of Jul 09, 2020  7:20 AM. If you have any questions, ask your nurse or doctor.        Accu-Chek Guide test strip Generic drug: glucose blood To use with insulin pump. Check blood sugar up to 7 times daily   aspirin 81 MG EC tablet Take by mouth.   atorvastatin 20 MG  tablet Commonly known as: LIPITOR   cetirizine 10 MG tablet Commonly known as: ZYRTEC Take 10 mg by mouth daily.   cholecalciferol 1000 units tablet Commonly known as: VITAMIN D Take 2,000 Units by mouth daily.   glucagon 1 MG injection Inject 1 mg into the muscle once as needed.   insulin lispro 100 UNIT/ML injection Commonly known as: HumaLOG INJECT SUBCUTANEOUSLY UP TO 100 UNITS A DAY VIA INSULIN PUMP   lisinopril 5 MG tablet Commonly known as: ZESTRIL Take by mouth.   pantoprazole 40 MG tablet Commonly known as: PROTONIX Take 40 mg by mouth daily.   pregabalin 50 MG capsule Commonly known as: LYRICA Take 1 capsule (50 mg total) by mouth daily.   topiramate 50 MG tablet Commonly known as: TOPAMAX Take 1 tablet (50 mg total) by mouth at bedtime. Must keep 08/2020 appt for further refills        OBJECTIVE:   Vital Signs: There were no vitals taken for this visit.  Wt Readings from Last 3 Encounters:  03/05/20 269 lb 2 oz (122.1 kg)  10/06/19 282 lb (127.9 kg)  05/18/19 281 lb (127.5 kg)     Exam: General: Pt appears well and is in NAD  Neck: General: Supple without adenopathy. Thyroid: Thyroid size normal.  No goiter or nodules appreciated. No thyroid bruit.  Lungs: Clear with good BS bilat with no rales, rhonchi, or wheezes  Heart: RRR with normal S1 and S2 and no gallops; no murmurs; no rub  Abdomen: Normoactive bowel sounds, soft, nontender, without masses or organomegaly palpable  Extremities: Trace pretibial edema.  Skin: Normal texture and temperature to palpation. No rash noted.  Neuro: MS is good with appropriate affect, pt is alert and Ox3    DM foot exam: 03/05/2020  The skin of the feet is intact without sores or ulcerations. The pedal pulses are 2+ on right and 2+ on left. The sensation is decreased  to a screening 5.07, 10 gram monofilament bilaterally     DATA REVIEWED:  Lab Results  Component Value Date   HGBA1C 9.0 (A) 03/05/2020    HGBA1C 8.3 (A) 10/06/2019   HGBA1C 8.3 (A) 05/18/2019   Lab Results  Component Value Date   MICROALBUR <0.7 10/06/2019   LDLCALC 89 10/06/2019   CREATININE 0.99 10/06/2019   Lab Results  Component Value Date   MICRALBCREAT 0.6 10/06/2019     Lab Results  Component Value Date   CHOL 152 10/06/2019   HDL 46.10 10/06/2019   LDLCALC 89 10/06/2019   TRIG  87.0 10/06/2019   CHOLHDL 3 10/06/2019         ASSESSMENT / PLAN / RECOMMENDATIONS:   1) Type 1 Diabetes Mellitus, Poorly controlled, With neuropathic  complications - Most recent A1c of 9.0 %. Goal A1c < 7.0 %.    - I have discussed with the patient the pathophysiology of diabetes. We went over the natural progression of the disease. We talked about both insulin resistance and insulin deficiency. We stressed the importance of lifestyle changes including diet and exercise. I explained the complications associated with diabetes including retinopathy, nephropathy, neuropathy as well as increased risk of cardiovascular disease. We went over the benefit seen with glycemic control.   - I have encouraged him to bolus with his first bite or prior to eating , and not after.  - We have not been able to download his pump due to software issue. I have manually reviewed some of the data, his BG now is 201 mg/dL , last time he ate was ~ 4 hrs ago.  - Will increased basal rate, adjust I:C ratio during the day and SF - He is intolerant to Metformin  - We briefly discussed GLP-1 agonists - will consider in the future   MEDICATIONS:  Humalog   BASAL SETTINGS (MANUAL MODE ONLY): Time  Basal Rate (units/ hr)   0000 1.8  0530 1.9  0800 2.0  1600 1.85  2100 1.75     BOLUS SETTINGS: Insulin:Carb Ratio:  0000 4 0600  3.0 1130  3.0 1800  3.5   Sensitivity (adjustable in manual mode only - calculated by algorithm in auto mode):  35   Target BG (adjustable in manual mode only - set at 120, correction bolus to 150 then microboluses do  remainder):  70-180      Active Insulin Time:  3 hrs      EDUCATION / INSTRUCTIONS:  BG monitoring instructions: Patient is instructed to check his blood sugars 4 times a day, before meals and bedtime.  Call [place] [facility] if: BG persistently < 70  . I reviewed the Rule of 15 for the treatment of hypoglycemia in detail with the patient. Literature supplied.    2) Diabetic complications:   Eye: Does not have known diabetic retinopathy.   Neuro/ Feet: Does  have known diabetic peripheral neuropathy .   Renal: Patient does not have known baseline CKD. He   is  on an ACEI/ARB at present.       3) Dyslipidemia : Patient is on Atorvastatin , he is not sure he needs this at this time. We discussed the cardiovascular benefits of statins as well as the ADA recommendations in being on statins. I would recommend continuing this.    Medication Atorvastatin 20 mg daily    F/U in 4 months    Signed electronically by: Lyndle Herrlich, MD  [facility] Endocrinology  [facility] [address] [place] [place], [place] [address] Phone: [phone] FAX: [phone]   CC: Clayborn Heron, MD [address] [place] [address] Phone: [phone]  Fax: [phone]  Return to Endocrinology clinic as below: Future Appointments  Date Time Provider Department Center  07/09/2020  8:30 AM Sutter Ahlgren, Konrad Dolores, MD [facility]  09/02/2020 11:00 AM York Spaniel, MD [facility] None

## 2020-09-02 ENCOUNTER — Telehealth: Payer: Self-pay | Admitting: Neurology

## 2020-09-02 ENCOUNTER — Ambulatory Visit: Payer: 59 | Admitting: Neurology

## 2020-09-02 NOTE — Telephone Encounter (Signed)
Pt cancelled appt due to having covid symptoms sore throat, nausea, headaches.

## 2020-11-05 ENCOUNTER — Encounter: Payer: Self-pay | Admitting: Internal Medicine

## 2020-11-05 ENCOUNTER — Ambulatory Visit: Payer: 59 | Admitting: Internal Medicine

## 2020-11-05 ENCOUNTER — Other Ambulatory Visit: Payer: Self-pay

## 2020-11-05 VITALS — BP 136/90 | HR 79 | Ht 69.0 in | Wt 257.0 lb

## 2020-11-05 DIAGNOSIS — E785 Hyperlipidemia, unspecified: Secondary | ICD-10-CM | POA: Diagnosis not present

## 2020-11-05 DIAGNOSIS — E1065 Type 1 diabetes mellitus with hyperglycemia: Secondary | ICD-10-CM | POA: Diagnosis not present

## 2020-11-05 DIAGNOSIS — E1042 Type 1 diabetes mellitus with diabetic polyneuropathy: Secondary | ICD-10-CM | POA: Diagnosis not present

## 2020-11-05 LAB — POCT GLYCOSYLATED HEMOGLOBIN (HGB A1C): Hemoglobin A1C: 9.4 % — AB (ref 4.0–5.6)

## 2020-11-05 LAB — GLUCOSE, POCT (MANUAL RESULT ENTRY): POC Glucose: 386 mg/dl — AB (ref 70–99)

## 2020-11-05 MED ORDER — TRULICITY 0.75 MG/0.5ML ~~LOC~~ SOAJ: 0.7500 mg | SUBCUTANEOUS | 3 refills | Status: DC

## 2020-11-05 NOTE — Patient Instructions (Signed)
-   Start Trulicity 0.75 mg weekly     

## 2020-11-05 NOTE — Progress Notes (Signed)
Name: Kenneth [name]  Age/ Sex: 45 y.o., male   MRN/ DOB: [id number], 30-Oct-1975     PCP: Kenneth Heron, MD   Reason for Endocrinology Evaluation: Type 1 Diabetes Mellitus  Initial Endocrine Consultative Visit: 03/05/2020    PATIENT IDENTIFIER: [name] is a 45 y.o. male with a past medical history of T1DM, HTN and OSA. The patient has followed with Endocrinology clinic since 03/05/2020 for consultative assistance with management of his diabetes.  DIABETIC HISTORY:  Kenneth [name] was diagnosed with DM in 1997 at age 10, he was started on insulin at diagnosis. Has been on Medtronic 670 G since 01/2016, has been on the Guardian since 02/2016.  His hemoglobin A1c has ranged from 7.2% in 2018, peaking at 8.5% in 2019.   Switched care from Dr. Elvera [name] in  02/2020. Prior to that he has been seen by Dr. Roanna [name] and Dr. Orvan [name] to Metformin , Novolog does not work well for him        Paternal grandmother with T1 DM    SUBJECTIVE:   During the last visit (03/05/2020): A1c 9.0% adjusted pump settings      Today (11/05/2020): Kenneth [name] is here for a follow up on diabetes management. He has NOT been to our clinic in 8 months.  He checks his blood sugars sporadically .   He had to change jobs so he could take his wife to her appointments ( she has gastroparesis ) , his salary has decreased, wife unable to work . Has not been checking glucose as the strips are cost prohibitive.     Changed jobs now 1630 to 230 AM so he changes his setting to match his work schedule ? Does wood Work   Has left frozen shoulder and trigger fingers- [facility]     PUMP SETTINGS & DOWNLOAD: Medtronic  Report dates: 9/7-9/20/2022   BASAL SETTINGS (MANUAL MODE ONLY): Time  Basal Rate (units/ hr)   0000 1.8  0500 1.9  0800 2.0   1600 1.85  2100 1.75         BOLUS SETTINGS: Insulin:Carb Ratio:  0000 4 0600  3.0 1130  3.0 1800  3.5   Sensitivity (adjustable in  manual mode only - calculated by algorithm in auto mode):  35   Target BG (adjustable in manual mode only - set at 120, correction bolus to 150 then microboluses do remainder):  70-180        Active Insulin Time:  3 hrs        Type & Model of Pump: Medtronic  Insulin Type: Currently using Humalog .  PUMP STATISTICS: Average BG: 176 +/- 116 BG Readings: 7 (0.5 / day) Average Daily Carbs (g): 131 +/- 67 Average Total Daily Insulin: 83.7 +/- 21.9 Average Daily Basal: 43.7 (52 %) Average Daily Bolus: 40 (48 %)      HOME DIABETES REGIMEN:  Humalog     Statin: yes ACE-I/ARB: yes Prior Diabetic Education: yes   METER DOWNLOAD SUMMARY: Does not check     DIABETIC COMPLICATIONS: Microvascular complications:  Neuropathy Denies: CKD, retinopathy Last Eye Exam: Completed 2021    Macrovascular complications:   Denies: CAD, CVA, PVD   HISTORY:  Past Medical History:  Past Medical History:  Diagnosis Date   Classic migraine 08/05/2016   Depression, major, single episode    Diabetes mellitus without complication (HCC)    H/O cardiac catheterization    findings were normal   Hypertension  Injury of hip, left September 2015   Sleep apnea    Past Surgical History:  Past Surgical History:  Procedure Laterality Date   CORONARY ARTERY BYPASS GRAFT  Around 2011 or 2012   VASECTOMY     WISDOM TOOTH EXTRACTION     x8   Social History:  reports that he has never smoked. He has never used smokeless tobacco. He reports that he does not drink alcohol and does not use drugs. Family History:  Family History  Problem Relation Age of Onset   Arthritis Mother    Leukemia Mother    Diabetes Father    Heart disease Father    Cancer Father    Migraines Father      HOME MEDICATIONS: Allergies as of 11/05/2020       Reactions   Dilantin [phenytoin] Other (See Comments)   Hallucinations   Gabapentin Other (See Comments)   Causes hallucinations         Medication List        Accurate as of November 05, 2020  9:22 AM. If you have any questions, ask your nurse or doctor.          Accu-Chek Guide test strip Generic drug: glucose blood To use with insulin pump. Check blood sugar up to 7 times daily   aspirin 81 MG EC tablet Take by mouth.   atorvastatin 20 MG tablet Commonly known as: LIPITOR   cetirizine 10 MG tablet Commonly known as: ZYRTEC Take 10 mg by mouth daily.   cholecalciferol 1000 units tablet Commonly known as: VITAMIN D Take 2,000 Units by mouth daily.   glucagon 1 MG injection Inject 1 mg into the muscle once as needed.   insulin lispro 100 UNIT/ML injection Commonly known as: HumaLOG INJECT SUBCUTANEOUSLY UP TO 100 UNITS A DAY VIA INSULIN PUMP   lisinopril 5 MG tablet Commonly known as: ZESTRIL Take by mouth.   pantoprazole 40 MG tablet Commonly known as: PROTONIX Take 40 mg by mouth daily.   pregabalin 50 MG capsule Commonly known as: LYRICA Take 1 capsule (50 mg total) by mouth daily.   topiramate 50 MG tablet Commonly known as: TOPAMAX Take 1 tablet (50 mg total) by mouth at bedtime. Must keep 08/2020 appt for further refills         OBJECTIVE:   Vital Signs: BP 136/90 (BP Location: Left Arm, Patient Position: Sitting, Cuff Size: Large)   Pulse 79   Ht 5\' 9"  (1.753 m)   Wt 257 lb (116.6 kg)   SpO2 97%   BMI 37.95 kg/m   Wt Readings from Last 3 Encounters:  11/05/20 257 lb (116.6 kg)  03/05/20 269 lb 2 oz (122.1 kg)  10/06/19 282 lb (127.9 kg)     Exam: General: Pt appears well and is in NAD  Lungs: Clear with good BS bilat with no rales, rhonchi, or wheezes  Heart: RRR with normal S1 and S2 and no gallops; no murmurs; no rub  Abdomen: Normoactive bowel sounds, soft, nontender, without masses or organomegaly palpable  Extremities: No pretibial edema.  Neuro: MS is good with appropriate affect, pt is alert and Ox3    DM foot exam: 03/05/2020   The skin of the feet is  intact without sores or ulcerations. The pedal pulses are 2+ on right and 2+ on left. The sensation is decreased  to a screening 5.07, 10 gram monofilament bilaterally           DATA REVIEWED:  Lab  Results  Component Value Date   HGBA1C 9.4 (A) 11/05/2020   HGBA1C 9.0 (A) 03/05/2020   HGBA1C 8.3 (A) 10/06/2019   Lab Results  Component Value Date   MICROALBUR <0.7 10/06/2019   LDLCALC 89 10/06/2019   CREATININE 0.99 10/06/2019   Lab Results  Component Value Date   MICRALBCREAT 0.6 10/06/2019     Lab Results  Component Value Date   CHOL 152 10/06/2019   HDL 46.10 10/06/2019   LDLCALC 89 10/06/2019   TRIG 87.0 10/06/2019   CHOLHDL 3 10/06/2019         ASSESSMENT / PLAN / RECOMMENDATIONS:   1) Type 1 Diabetes Mellitus, Poorly controlled, With neuropathic complications - Most recent A1c of 9.4 %. Goal A1c < 7.0 %.     - His A1c  has worsened , up from 9.0%  - Pt with social determinants  - He is intolerant to Metformin  - We discussed GLP-1 agonists, coupon provided but if this cost-prohibitive, he will not get it . Cautioned against GI side effects. No personal hx of pancreatitis.  - Unfortunately very limited data today as his pump download has no glucose readings, pt advised to use ReliON brand.  - I have praised him on entering carbs with meals  - He believes he will have to switch to MDI regimen by the next visit due to pump cost.     MEDICATIONS: Humalog  Start Trulicity 0.75 mg weekly   EDUCATION / INSTRUCTIONS: BG monitoring instructions: Patient is instructed to check his blood sugars 3 times a day, before meals . Call [facility] if: BG persistently < 70 or > 300. I reviewed the Rule of 15 for the treatment of hypoglycemia in detail with the patient. Literature supplied.   2) Diabetic complications:  Eye: Does not have known diabetic retinopathy.  Neuro/ Feet: Does have known diabetic peripheral neuropathy .  Renal: Patient does  not have known baseline CKD. He   is on an ACEI/ARB at present.   3) Dyslipidemia :     - LDL has been at goal    Medication Continue Atorvastatin 20 mg daily     F/U in 3-  4 months     Signed electronically by: Lyndle Herrlich, MD  [facility]  [facility] [address] [place] [place], [place] [address] Phone: [phone] FAX: [phone]   CC: Kenneth Heron, MD [address] [place] [address] Phone: [phone]  Fax: [phone]  Return to Endocrinology clinic as below: Future Appointments  Date Time Provider Department Center  03/05/2021  7:45 AM Glean Salvo, NP [facility] None

## 2020-11-21 ENCOUNTER — Other Ambulatory Visit: Payer: Self-pay | Admitting: Internal Medicine

## 2020-11-21 ENCOUNTER — Encounter: Payer: Self-pay | Admitting: Internal Medicine

## 2020-11-21 MED ORDER — PREGABALIN 50 MG PO CAPS: 50.0000 mg | ORAL_CAPSULE | Freq: Every day | ORAL | 1 refills | Status: DC

## 2020-11-23 ENCOUNTER — Other Ambulatory Visit: Payer: Self-pay | Admitting: Internal Medicine

## 2020-11-25 ENCOUNTER — Encounter: Payer: Self-pay | Admitting: Internal Medicine

## 2020-11-25 ENCOUNTER — Other Ambulatory Visit: Payer: Self-pay

## 2020-11-25 MED ORDER — HUMALOG 100 UNIT/ML ~~LOC~~ SOLN: SUBCUTANEOUS | 2 refills | Status: DC

## 2020-11-25 NOTE — Telephone Encounter (Signed)
Medication has been sent.  

## 2021-02-28 ENCOUNTER — Other Ambulatory Visit: Payer: Self-pay

## 2021-02-28 ENCOUNTER — Telehealth: Payer: Self-pay | Admitting: Family Medicine

## 2021-02-28 MED ORDER — HUMALOG 100 UNIT/ML ~~LOC~~ SOLN: SUBCUTANEOUS | 1 refills | Status: DC

## 2021-02-28 MED ORDER — HUMALOG 100 UNIT/ML ~~LOC~~ SOLN: SUBCUTANEOUS | 3 refills | Status: DC

## 2021-02-28 MED ORDER — PREGABALIN 50 MG PO CAPS: 50.0000 mg | ORAL_CAPSULE | Freq: Every day | ORAL | 1 refills | Status: DC

## 2021-02-28 NOTE — Telephone Encounter (Signed)
Medication:  pregabalin (LYRICA) 50 MG capsule [[id number]]  insulin lispro (HUMALOG) 100 UNIT/ML injection [[id number]   Has the patient contacted their pharmacy? No. (If no, request that the patient contact the pharmacy for the refill.) (If yes, when and what did the pharmacy advise?)     Preferred Pharmacy (with phone number or street name):  [facility] [address] [place], [place], [place] [address] [phone]    Agent: Please be advised that RX refills may take up to 3 business days. We ask that you follow-up with your pharmacy.

## 2021-02-28 NOTE — Telephone Encounter (Addendum)
Patient notified that script has been sent.

## 2021-03-05 ENCOUNTER — Ambulatory Visit: Payer: 59 | Admitting: Neurology

## 2021-03-11 ENCOUNTER — Ambulatory Visit: Payer: 59 | Admitting: Internal Medicine

## 2021-04-15 DIAGNOSIS — M65341 Trigger finger, right ring finger: Secondary | ICD-10-CM | POA: Diagnosis not present

## 2021-04-15 DIAGNOSIS — M65321 Trigger finger, right index finger: Secondary | ICD-10-CM | POA: Diagnosis not present

## 2021-04-15 DIAGNOSIS — M65351 Trigger finger, right little finger: Secondary | ICD-10-CM | POA: Diagnosis not present

## 2021-04-15 DIAGNOSIS — R200 Anesthesia of skin: Secondary | ICD-10-CM | POA: Diagnosis not present

## 2021-05-13 DIAGNOSIS — M65321 Trigger finger, right index finger: Secondary | ICD-10-CM | POA: Diagnosis not present

## 2021-05-13 DIAGNOSIS — M65341 Trigger finger, right ring finger: Secondary | ICD-10-CM | POA: Diagnosis not present

## 2021-05-17 ENCOUNTER — Telehealth: Payer: Self-pay | Admitting: Neurology

## 2021-05-17 ENCOUNTER — Other Ambulatory Visit: Payer: Self-pay | Admitting: Neurology

## 2021-05-17 MED ORDER — TOPIRAMATE 50 MG PO TABS: 50.0000 mg | ORAL_TABLET | Freq: Every day | ORAL | 0 refills | Status: DC

## 2021-05-17 NOTE — Telephone Encounter (Signed)
Phone request by Wife Kenneth [name]) of patient Kenneth [name] on a Saturday to answerphone- he is out of " headache" meds.  ?He no showed on 09-02-20 ( same day cancellation)  , was told on 07-04-2020 by Dr Anne Hahn he needed to keep that appointment for further refills- Topamax was stated to have been discontinued after 90 days at that time. Last appointment was on 03-05-2020 . ?  ? ?NO refills without revisit from this office.  ? ?Requesting [facility]. ?

## 2021-05-21 DIAGNOSIS — M7502 Adhesive capsulitis of left shoulder: Secondary | ICD-10-CM | POA: Insufficient documentation

## 2021-05-21 DIAGNOSIS — R002 Palpitations: Secondary | ICD-10-CM | POA: Diagnosis not present

## 2021-05-21 DIAGNOSIS — E1042 Type 1 diabetes mellitus with diabetic polyneuropathy: Secondary | ICD-10-CM | POA: Diagnosis not present

## 2021-05-21 DIAGNOSIS — Z8659 Personal history of other mental and behavioral disorders: Secondary | ICD-10-CM | POA: Insufficient documentation

## 2021-05-21 DIAGNOSIS — E785 Hyperlipidemia, unspecified: Secondary | ICD-10-CM | POA: Diagnosis not present

## 2021-05-21 DIAGNOSIS — G43B0 Ophthalmoplegic migraine, not intractable: Secondary | ICD-10-CM | POA: Diagnosis not present

## 2021-05-22 DIAGNOSIS — R002 Palpitations: Secondary | ICD-10-CM | POA: Diagnosis not present

## 2021-05-23 DIAGNOSIS — E1042 Type 1 diabetes mellitus with diabetic polyneuropathy: Secondary | ICD-10-CM | POA: Diagnosis not present

## 2021-05-23 DIAGNOSIS — Z7689 Persons encountering health services in other specified circumstances: Secondary | ICD-10-CM | POA: Diagnosis not present

## 2021-05-23 DIAGNOSIS — R002 Palpitations: Secondary | ICD-10-CM | POA: Diagnosis not present

## 2021-05-23 DIAGNOSIS — E785 Hyperlipidemia, unspecified: Secondary | ICD-10-CM | POA: Diagnosis not present

## 2021-05-25 ENCOUNTER — Other Ambulatory Visit: Payer: Self-pay

## 2021-05-29 ENCOUNTER — Ambulatory Visit: Payer: BC Managed Care – PPO | Admitting: Neurology

## 2021-05-29 ENCOUNTER — Encounter: Payer: Self-pay | Admitting: Neurology

## 2021-05-29 VITALS — BP 151/92 | HR 96 | Ht 69.0 in | Wt 265.0 lb

## 2021-05-29 DIAGNOSIS — E7800 Pure hypercholesterolemia, unspecified: Secondary | ICD-10-CM | POA: Insufficient documentation

## 2021-05-29 DIAGNOSIS — G4733 Obstructive sleep apnea (adult) (pediatric): Secondary | ICD-10-CM | POA: Insufficient documentation

## 2021-05-29 DIAGNOSIS — G43109 Migraine with aura, not intractable, without status migrainosus: Secondary | ICD-10-CM | POA: Diagnosis not present

## 2021-05-29 DIAGNOSIS — Z800 Family history of malignant neoplasm of digestive organs: Secondary | ICD-10-CM | POA: Insufficient documentation

## 2021-05-29 DIAGNOSIS — K625 Hemorrhage of anus and rectum: Secondary | ICD-10-CM | POA: Insufficient documentation

## 2021-05-29 DIAGNOSIS — Z794 Long term (current) use of insulin: Secondary | ICD-10-CM | POA: Insufficient documentation

## 2021-05-29 DIAGNOSIS — G8929 Other chronic pain: Secondary | ICD-10-CM | POA: Insufficient documentation

## 2021-05-29 MED ORDER — TOPIRAMATE 50 MG PO TABS: 50.0000 mg | ORAL_TABLET | Freq: Every day | ORAL | 3 refills | Status: DC

## 2021-05-29 NOTE — Progress Notes (Signed)
? ? ?Patient: Kenneth [name] ?Date of Birth: March 10, 1975 ? ?Reason for Visit: Follow up for migraine ?History from: Patient ?Primary Neurologist: Dr. Jannifer Franklin ? ?ASSESSMENT AND PLAN ?46 y.o. year old male  ? ?Chronic migraine headache ?-Doing well on Topamax  ?-Continue Topamax 50 mg daily for migraine prevention  ?-Has excellent response to Tylenol/NSAID ?-Since stable, PCP can refill going forward, return back here if medication adjustment is needed ? ?HISTORY OF PRESENT ILLNESS: ?Today 05/29/21 ?Kenneth [name] is here today for follow-up with history of migraine headache.  He has not been seen since April 2020.  Remains on Topamax 50 mg at bedtime. Ran out few weeks ago, didn't take for 1 week, had migraine for 4 days. On Topamax, has 1 a month. High glucose can trigger. Will take Tylenol or Ibuprofen with excellent benefit. Works for [employer] working company. Doesn't miss any work due to headache. No major health issues. Uses CPAP nightly. $40 co-pay for every doctor seen. ? ?HISTORY  ?05/22/2018 Dr. Jannifer Franklin: Kenneth [name] is a 46 year old right-handed white male with a history of migraine headache.  Overall, he has been doing fairly well with his headaches, he has on average 1 a week but the headache is very responsive to Tylenol or Advil.  He indicates that he takes over-the-counter medications the headache will go away rapidly.  He is on low-dose Topamax taking 50 mg at night, he is tolerating medication well and he believes that this has significantly reduced the headache frequency.  The patient is working on a regular basis, he is not missing work because of headache.  He notes that occasionally his headache will be activated by elevated blood sugars, or a flash of light.  He has diabetes, his most recent hemoglobin A1c was around 7.8.  The patient reports some troubles with a frozen shoulder on the left, he is seeing orthopedic surgery for this.  Otherwise, he has not noted any new medical issues that have  come up since last seen.  The patient is on Lyrica for diabetic neuropathy, he does have some discomfort in the hands and feet, he generally will sleep fairly well at night. ? ?REVIEW OF SYSTEMS: Out of a complete 14 system review of symptoms, the patient complains only of the following symptoms, and all other reviewed systems are negative. ? ?See HPI ? ?ALLERGIES: ?Allergies  ?Allergen Reactions  ? Dilantin [Phenytoin] Other (See Comments)  ?  Hallucinations  ? Gabapentin Other (See Comments)  ?  Causes hallucinations  ? ? ?HOME MEDICATIONS: ?Outpatient Medications Prior to Visit  ?Medication Sig Dispense Refill  ? aspirin 81 MG EC tablet Take by mouth.    ? atorvastatin (LIPITOR) 20 MG tablet   2  ? cetirizine (ZYRTEC) 10 MG tablet Take 10 mg by mouth daily.    ? cholecalciferol (VITAMIN D) 1000 units tablet Take 2,000 Units by mouth daily.    ? glucagon (GLUCAGON EMERGENCY) 1 MG injection Inject 1 mg into the muscle once as needed. 1 each prn  ? glucose blood (ACCU-CHEK GUIDE) test strip To use with insulin pump. Check blood sugar up to 7 times daily 700 each 12  ? insulin lispro (HUMALOG) 100 UNIT/ML injection INJECT SUBCUTANEOUSLY UP TO 100 UNITS A DAY VIA INSULIN PUMP 90 mL 3  ? lisinopril (PRINIVIL,ZESTRIL) 5 MG tablet Take by mouth.    ? pantoprazole (PROTONIX) 40 MG tablet Take 40 mg by mouth daily.    ? pregabalin (LYRICA) 50 MG capsule Take 1 capsule (50  mg total) by mouth daily. 90 capsule 1  ? topiramate (TOPAMAX) 50 MG tablet Take 1 tablet (50 mg total) by mouth at bedtime. Must keep 05-29-2021 appt. 14 tablet 0  ? Dulaglutide (TRULICITY) [id number] [id number] SOPN Inject 0.75 mg into the skin once a week. 2 mL 3  ? predniSONE (STERAPRED UNI-PAK 48 TAB) 5 MG (48) TBPK tablet Take 5 mg by mouth as directed.    ? ?No facility-administered medications prior to visit.  ? ? ?PAST MEDICAL HISTORY: ?Past Medical History:  ?Diagnosis Date  ? Classic migraine 08/05/2016  ? Depression, major, single episode   ? Diabetes  mellitus without complication ([place])   ? H/O cardiac catheterization   ? findings were normal  ? Hypertension   ? Injury of hip, left September 2015  ? Sleep apnea   ? ? ?PAST SURGICAL HISTORY: ?Past Surgical History:  ?Procedure Laterality Date  ? CORONARY ARTERY BYPASS GRAFT  Around 2011 or 2012  ? VASECTOMY    ? WISDOM TOOTH EXTRACTION    ? x8  ? ? ?FAMILY HISTORY: ?Family History  ?Problem Relation Age of Onset  ? Arthritis Mother   ? Leukemia Mother   ? Diabetes Father   ? Heart disease Father   ? Cancer Father   ? Migraines Father   ? ? ?SOCIAL HISTORY: ?Social History  ? ?Socioeconomic History  ? Marital status: Married  ?  Spouse name: Coralyn Mark  ? Number of children: 3  ? Years of education: Some college  ? Highest education level: Not on file  ?Occupational History  ? Occupation: food Primary school teacher [employer]  ? Occupation: Full time student  ?Tobacco Use  ? Smoking status: Never  ? Smokeless tobacco: Never  ?Vaping Use  ? Vaping Use: Never used  ?Substance and Sexual Activity  ? Alcohol use: No  ? Drug use: No  ? Sexual activity: Not on file  ?Other Topics Concern  ? Not on file  ?Social History Narrative  ? Lives with wife, son and mother  ? Caffeine use: Drinks decaf at home  ? Caffeine drinks out to eat- 4-5 times per week  ? Right handed  ? ?Social Determinants of Health  ? ?Financial Resource Strain: Not on file  ?Food Insecurity: Not on file  ?Transportation Needs: Not on file  ?Physical Activity: Not on file  ?Stress: Not on file  ?Social Connections: Not on file  ?Intimate Partner Violence: Not on file  ? ? ?PHYSICAL EXAM ? ?Vitals:  ? 05/29/21 1337  ?BP: (!) 151/92  ?Pulse: 96  ?Weight: 265 lb (120.2 kg)  ?Height: 5\' 9"  (1.753 m)  ? ?Body mass index is 39.13 kg/m?. ? ?Generalized: Well developed, in no acute distress  ?Neurological examination  ?Mentation: Alert oriented to time, place, history taking. Follows all commands speech and language fluent ?Cranial nerve II-XII: Pupils were equal round reactive  to light. Extraocular movements were full, visual field were full on confrontational test. Facial sensation and strength were normal. Head turning and shoulder shrug  were normal and symmetric. ?Motor: The motor testing reveals 5 over 5 strength of all 4 extremities. Good symmetric motor tone is noted throughout.  ?Sensory: Sensory testing is intact to soft touch on all 4 extremities. No evidence of extinction is noted.  ?Coordination: Cerebellar testing reveals good finger-nose-finger and heel-to-shin bilaterally.  ?Gait and station: Gait is normal.  ?Reflexes: Deep tendon reflexes are symmetric and normal bilaterally.  ? ?DIAGNOSTIC DATA (LABS, IMAGING, TESTING) ?- I  reviewed patient records, labs, notes, testing and imaging myself where available. ? ?Lab Results  ?Component Value Date  ? WBC 9.6 07/28/2016  ? HGB 14.6 07/28/2016  ? HCT 42.2 07/28/2016  ? MCV 81.5 07/28/2016  ? PLT 238 07/28/2016  ? ?   ?Component Value Date/Time  ? NA 142 10/06/2019 1017  ? NA 138 01/29/2015 0000  ? K 4.3 10/06/2019 1017  ? CL 108 10/06/2019 1017  ? CO2 26 10/06/2019 1017  ? GLUCOSE 117 (H) 10/06/2019 1017  ? BUN 17 10/06/2019 1017  ? BUN 10 01/29/2015 0000  ? CREATININE 0.99 10/06/2019 1017  ? CREATININE 1.03 06/25/2017 0947  ? CALCIUM 9.2 10/06/2019 1017  ? PROT 6.9 10/06/2019 1017  ? ALBUMIN 4.2 10/06/2019 1017  ? AST 18 10/06/2019 1017  ? ALT 25 10/06/2019 1017  ? ALKPHOS 78 10/06/2019 1017  ? BILITOT 0.5 10/06/2019 1017  ? GFRNONAA 89 06/25/2017 0947  ? GFRAA 103 06/25/2017 0947  ? ?Lab Results  ?Component Value Date  ? CHOL 152 10/06/2019  ? HDL 46.10 10/06/2019  ? [place] 89 10/06/2019  ? TRIG 87.0 10/06/2019  ? CHOLHDL 3 10/06/2019  ? ?Lab Results  ?Component Value Date  ? HGBA1C 9.4 (A) 11/05/2020  ? ?No results found for: VITAMINB12 ?Lab Results  ?Component Value Date  ? TSH 4.36 10/06/2019  ? ? ?Butler Denmark, AGNP-C, DNP 05/29/2021, 1:50 PM ?[facility] ?[place], [address] ?[place], [place]  [address] ?[phone] ? ?

## 2021-05-29 NOTE — Patient Instructions (Signed)
Continue Topamax, call for any worsening headache  ?See you back as needed ?

## 2021-07-04 DIAGNOSIS — E1065 Type 1 diabetes mellitus with hyperglycemia: Secondary | ICD-10-CM | POA: Diagnosis not present

## 2021-07-04 DIAGNOSIS — Z6839 Body mass index (BMI) 39.0-39.9, adult: Secondary | ICD-10-CM | POA: Diagnosis not present

## 2021-07-04 DIAGNOSIS — E785 Hyperlipidemia, unspecified: Secondary | ICD-10-CM | POA: Diagnosis not present

## 2021-07-04 DIAGNOSIS — E1140 Type 2 diabetes mellitus with diabetic neuropathy, unspecified: Secondary | ICD-10-CM | POA: Diagnosis not present

## 2021-11-28 ENCOUNTER — Other Ambulatory Visit (HOSPITAL_COMMUNITY): Payer: Self-pay | Admitting: Internal Medicine

## 2021-11-28 DIAGNOSIS — R002 Palpitations: Secondary | ICD-10-CM

## 2021-12-05 ENCOUNTER — Ambulatory Visit
Admission: RE | Admit: 2021-12-05 | Discharge: 2021-12-05 | Disposition: A | Discharge status: Home / Self Care | Payer: BC Managed Care – PPO | Source: Ambulatory Visit | Attending: Internal Medicine | Admitting: Internal Medicine

## 2021-12-05 ENCOUNTER — Other Ambulatory Visit: Payer: Self-pay | Admitting: Internal Medicine

## 2021-12-05 DIAGNOSIS — M7711 Lateral epicondylitis, right elbow: Secondary | ICD-10-CM

## 2021-12-30 ENCOUNTER — Telehealth (HOSPITAL_COMMUNITY): Payer: Self-pay | Admitting: Internal Medicine

## 2021-12-30 NOTE — Telephone Encounter (Signed)
Just an FYI. Patient cancelled GXT for reason below:  12/30/2021 8:22 AM [name], Kenneth B  Cancel Rsn: Patient (Pt cancelled due to work conflict and did not want to reschedule at this time)   Order will be removed from the active WQ.    Thank you

## 2022-01-02 ENCOUNTER — Encounter (HOSPITAL_COMMUNITY): Payer: Self-pay

## 2022-01-02 ENCOUNTER — Ambulatory Visit (HOSPITAL_COMMUNITY): Payer: BC Managed Care – PPO

## 2022-02-19 ENCOUNTER — Other Ambulatory Visit: Payer: Self-pay | Admitting: Internal Medicine

## 2022-02-19 ENCOUNTER — Ambulatory Visit (HOSPITAL_COMMUNITY): Payer: BC Managed Care – PPO | Attending: Internal Medicine

## 2022-02-19 ENCOUNTER — Ambulatory Visit
Admission: RE | Admit: 2022-02-19 | Discharge: 2022-02-19 | Disposition: A | Discharge status: Home / Self Care | Payer: BC Managed Care – PPO | Source: Ambulatory Visit | Attending: Internal Medicine | Admitting: Internal Medicine

## 2022-02-19 DIAGNOSIS — R002 Palpitations: Secondary | ICD-10-CM | POA: Insufficient documentation

## 2022-02-19 DIAGNOSIS — R0789 Other chest pain: Secondary | ICD-10-CM

## 2022-02-19 LAB — EXERCISE TOLERANCE TEST
Angina Index: 0
Duke Treadmill Score: 7
Estimated workload: 7.7
Exercise duration (min): 6 min
Exercise duration (sec): 30 s
MPHR: 173 {beats}/min
Peak HR: 176 {beats}/min
Percent HR: 101 %
RPE: 17
Rest HR: 82 {beats}/min
ST Depression (mm): 0 mm

## 2022-03-04 ENCOUNTER — Other Ambulatory Visit (HOSPITAL_COMMUNITY): Payer: Self-pay | Admitting: Internal Medicine

## 2022-03-04 DIAGNOSIS — K3184 Gastroparesis: Secondary | ICD-10-CM

## 2022-03-24 ENCOUNTER — Encounter: Payer: Self-pay | Admitting: Internal Medicine

## 2022-03-24 ENCOUNTER — Ambulatory Visit (INDEPENDENT_AMBULATORY_CARE_PROVIDER_SITE_OTHER): Payer: BC Managed Care – PPO

## 2022-03-24 ENCOUNTER — Ambulatory Visit: Payer: BC Managed Care – PPO | Attending: Internal Medicine | Admitting: Internal Medicine

## 2022-03-24 VITALS — BP 128/84 | HR 76 | Ht 69.0 in | Wt 268.8 lb

## 2022-03-24 DIAGNOSIS — R002 Palpitations: Secondary | ICD-10-CM | POA: Diagnosis not present

## 2022-03-24 NOTE — Progress Notes (Signed)
Cardiology Office Note:    Date:  03/24/2022   ID:  Kenneth [name], Kenneth [name] August 23, 1975, MRN [id number]  PCP:  Scheryl Marten, [place] [facility] Cardiologist:  None     Referring MD: Lurena Joiner, NP   No chief complaint on file. Palpitations  History of Present Illness:    Kenneth [name] is a 47 y.o. male with a hx of depression, DM2, migraine HA,  HTN referral for palpitations and dizziness. He had a heart monitor and noted he sweated off a monitor.  He will try again. No syncope. Minimal caffeine.   ETT normal 02/19/2022- hypertensive response, otherwise Mets were fair. No ischemic ST-T    Past Medical History:  Diagnosis Date   Classic migraine 08/05/2016   Depression, major, single episode    Diabetes mellitus without complication ([place])    H/O cardiac catheterization    findings were normal   Hypertension    Injury of hip, left September 2015   Sleep apnea     Past Surgical History:  Procedure Laterality Date   CORONARY ARTERY BYPASS GRAFT  Around 2011 or 2012   VASECTOMY     WISDOM TOOTH EXTRACTION     x8    Current Medications: Current Meds  Medication Sig   aspirin 81 MG EC tablet Take by mouth.   atorvastatin (LIPITOR) 20 MG tablet    cetirizine (ZYRTEC) 10 MG tablet Take 10 mg by mouth daily.   cholecalciferol (VITAMIN D) 1000 units tablet Take 2,000 Units by mouth daily.   Continuous Blood Gluc Sensor (DEXCOM G6 SENSOR) MISC SMARTSIG:1 Topical Every 10 Days   Continuous Blood Gluc Transmit (DEXCOM G6 TRANSMITTER) MISC USE TRANSMITTER FOR 90 DAYS TO MONITOR BLOOD SUGAR. DO NOT DISCARD UNTIL AFTER 3 MONTHS.   glucagon (GLUCAGON EMERGENCY) 1 MG injection Inject 1 mg into the muscle once as needed.   lisinopril (PRINIVIL,ZESTRIL) 5 MG tablet Take by mouth.   NOVOLOG 100 UNIT/ML injection    pantoprazole (PROTONIX) 40 MG tablet Take 40 mg by mouth daily.   pregabalin (LYRICA) 75 MG capsule 1 capsule by mouth once a day at  bedtime for 90 days   topiramate (TOPAMAX) 50 MG tablet Take 1 tablet (50 mg total) by mouth at bedtime.     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 [employer]   Occupation: Full time student  Tobacco Use   Smoking status: Never   Smokeless tobacco: Never  Vaping Use   Vaping Use: Never used  Substance and Sexual Activity   Alcohol use: No   Drug use: No   Sexual activity: Not on file  Other Topics Concern   Not on file  Social History Narrative   Lives with wife, son and mother   Caffeine use: Drinks decaf at home   Caffeine drinks out to eat- 4-5 times per week   Right handed   Social Determinants of Health   Financial Resource Strain: Not on file  Food Insecurity: Not on file  Transportation Needs: Not on file  Physical Activity: Not on file  Stress: Not on file  Social Connections: Not on file     Family History: The patient's  family history includes Arthritis in his mother; Cancer in his father; Diabetes in his father; Heart disease in his father; Leukemia in his mother; Migraines in his father.  ROS:   Please see the history of present illness.     All other systems reviewed and are negative.  EKGs/Labs/Other Studies Reviewed:    The following studies were reviewed today:   EKG:  EKG is  ordered today.  The ekg ordered today demonstrates   03/24/2022- NSR  Recent Labs: No results found for requested labs within last 365 days.   Recent Lipid Panel    Component Value Date/Time   CHOL 152 10/06/2019 1017   TRIG 87.0 10/06/2019 1017   HDL 46.10 10/06/2019 1017   CHOLHDL 3 10/06/2019 1017   VLDL 17.4 10/06/2019 1017   LDLCALC 89 10/06/2019 1017     Risk Assessment/Calculations:     Physical Exam:     VS:    Vitals:   03/24/22 0746  BP: 128/84  Pulse: 76  SpO2: 100%     BP 128/84   Pulse 76   Ht 5\' 9"  (1.753 m)   Wt 268 lb 12.8 oz (121.9 kg)   SpO2 100%   BMI 39.69 kg/m     Wt Readings from Last 3 Encounters:  03/24/22 268 lb 12.8 oz (121.9 kg)  05/29/21 265 lb (120.2 kg)  11/05/20 257 lb (116.6 kg)     GEN:  Well nourished, well developed in no acute distress HEENT: Normal NECK: No JVD; No carotid bruits LYMPHATICS: No lymphadenopathy CARDIAC: RRR, no murmurs, rubs, gallops RESPIRATORY:  Clear to auscultation without rales, wheezing or rhonchi  ABDOMEN: Soft, non-tender, non-distended MUSCULOSKELETAL:  No edema; No deformity  SKIN: Warm and dry NEUROLOGIC:  Alert and oriented x 3 PSYCHIATRIC:  Normal affect   ASSESSMENT:    Palpitations: 7 day ziopatch PLAN:    In order of problems listed above:  7 day ziopatch Follow up pending results          Medication Adjustments/Labs and Tests Ordered: Current medicines are reviewed at length with the patient today.  Concerns regarding medicines are outlined above.  No orders of the defined types were placed in this encounter.  No orders of the defined types were placed in this encounter.   There are no Patient Instructions on file for this visit.   Signed, Janina Mayo, MD  03/24/2022 7:58 AM    [place]

## 2022-03-24 NOTE — Progress Notes (Unsigned)
Enrolled for [employer] to mail a ZIO XT long term holter monitor to the patients address on file.  

## 2022-03-24 NOTE — Patient Instructions (Signed)
Medication Instructions:  No Changes In Medications at this time.  *If you need a refill on your cardiac medications before your next appointment, please call your pharmacy*  Lab Work: None Ordered At This Time.  If you have labs (blood work) drawn today and your tests are completely normal, you will receive your results only by: MyChart Message (if you have MyChart) OR A paper copy in the mail If you have any lab test that is abnormal or we need to change your treatment, we will call you to review the results.  Testing/Procedures:  ZIO XT- Long Term Monitor Instructions   Your physician has requested you wear your ZIO patch monitor___7____days.   This is a single patch monitor.  [employer] supplies one patch monitor per enrollment.  Additional stickers are not available.   Please do not apply patch if you will be having a Nuclear Stress Test, Echocardiogram, Cardiac CT, MRI, or Chest Xray during the time frame you would be wearing the monitor. The patch cannot be worn during these tests.  You cannot remove and re-apply the ZIO XT patch monitor.   Your ZIO patch monitor will be sent USPS Priority mail from [employer] directly to your home address. The monitor may also be mailed to a PO BOX if home delivery is not available.   It may take 3-5 days to receive your monitor after you have been enrolled.   Once you have received you monitor, please review enclosed instructions.  Your monitor has already been registered assigning a specific monitor serial # to you.   Applying the monitor   Shave hair from upper left chest.   Hold abrader disc by orange tab.  Rub abrader in 40 strokes over left upper chest as indicated in your monitor instructions.   Clean area with 4 enclosed alcohol pads .  Use all pads to assure are is cleaned thoroughly.  Let dry.   Apply patch as indicated in monitor instructions.  Patch will be place under collarbone on left side of chest with arrow pointing  upward.   Rub patch adhesive wings for 2 minutes.Remove white label marked "1".  Remove white label marked "2".  Rub patch adhesive wings for 2 additional minutes.   While looking in a mirror, press and release button in center of patch.  A small green light will flash 3-4 times .  This will be your only indicator the monitor has been turned on.     Do not shower for the first 24 hours.  You may shower after the first 24 hours.   Press button if you feel a symptom. You will hear a small click.  Record Date, Time and Symptom in the Patient Log Book.   When you are ready to remove patch, follow instructions on last 2 pages of Patient Log Book.  Stick patch monitor onto last page of Patient Log Book.   Place Patient Log Book in Blue box.  Use locking tab on box and tape box closed securely.  The Orange and White box has prepaid postage on it.  Please place in mailbox as soon as possible.  Your physician should have your test results approximately 7 days after the monitor has been mailed back to [employer].   Call [employer] Customer Care at [phone] if you have questions regarding your ZIO XT patch monitor.  Call them immediately if you see an orange light blinking on your monitor.   If your monitor falls off in less   than 4 days contact our Monitor department at [phone].  If your monitor becomes loose or falls off after 4 days call [employer] at [phone] for suggestions on securing your monitor.    Follow-Up: At [facility], you and your health needs are our priority.  As part of our continuing mission to provide you with exceptional heart care, we have created designated Provider Care Teams.  These Care Teams include your primary Cardiologist (physician) and Advanced Practice Providers (APPs -  Physician Assistants and Nurse Practitioners) who all work together to provide you with the care you need, when you need it.   Your next appointment:   AS NEEDED    Provider:   Janina Mayo, MD

## 2022-03-30 ENCOUNTER — Encounter (HOSPITAL_COMMUNITY): Payer: BC Managed Care – PPO

## 2022-03-31 DIAGNOSIS — R002 Palpitations: Secondary | ICD-10-CM | POA: Diagnosis not present

## 2022-04-20 ENCOUNTER — Encounter (HOSPITAL_COMMUNITY): Payer: BC Managed Care – PPO

## 2022-04-20 ENCOUNTER — Encounter (HOSPITAL_COMMUNITY): Payer: Self-pay

## 2022-07-07 ENCOUNTER — Other Ambulatory Visit: Payer: Self-pay | Admitting: Neurology

## 2022-07-10 ENCOUNTER — Other Ambulatory Visit: Payer: Self-pay | Admitting: Neurology

## 2022-08-17 DIAGNOSIS — M79642 Pain in left hand: Secondary | ICD-10-CM | POA: Diagnosis not present

## 2022-08-17 DIAGNOSIS — Z8739 Personal history of other diseases of the musculoskeletal system and connective tissue: Secondary | ICD-10-CM | POA: Diagnosis not present

## 2022-08-17 DIAGNOSIS — M79641 Pain in right hand: Secondary | ICD-10-CM | POA: Diagnosis not present

## 2022-09-21 DIAGNOSIS — E1042 Type 1 diabetes mellitus with diabetic polyneuropathy: Secondary | ICD-10-CM | POA: Diagnosis not present

## 2022-09-21 DIAGNOSIS — R946 Abnormal results of thyroid function studies: Secondary | ICD-10-CM | POA: Diagnosis not present

## 2022-09-21 DIAGNOSIS — E7800 Pure hypercholesterolemia, unspecified: Secondary | ICD-10-CM | POA: Diagnosis not present

## 2022-09-21 DIAGNOSIS — E1065 Type 1 diabetes mellitus with hyperglycemia: Secondary | ICD-10-CM | POA: Diagnosis not present

## 2022-12-10 ENCOUNTER — Ambulatory Visit: Payer: BC Managed Care – PPO | Admitting: Neurology

## 2022-12-10 VITALS — BP 130/86 | HR 82 | Ht 69.0 in | Wt 265.5 lb

## 2022-12-10 DIAGNOSIS — G43109 Migraine with aura, not intractable, without status migrainosus: Secondary | ICD-10-CM

## 2022-12-10 MED ORDER — TOPIRAMATE 100 MG PO TABS: 100.0000 mg | ORAL_TABLET | Freq: Every day | ORAL | 3 refills | Status: DC

## 2022-12-10 NOTE — Progress Notes (Signed)
ASSESSMENT AND PLAN 47 y.o. year old male   Chronic migraine headache  Topamax helps, but still with frequent headaches, will increase Topamax from 50 to 100 mg every night  Imitrex as needed, may combine with Aleve, for prolonged severe headaches  DIAGNOSTIC DATA (LABS, IMAGING, TESTING) - I reviewed patient records, labs, notes, testing and imaging myself where available.    HISTORY OF PRESENT ILLNESS: [name] follow-up for migraine headache, was under the care of of Dr. Anne Hahn and Maralyn Sago in the past,  PMHX DM-I, on insulin pump HLD HTN Chronic migraine. OSA-CPAP  Patient reported long history of chronic migraine headache, started since high school, occasionally visual auras, reviewing the chart, in 2018, she had 1 episode of visual disturbance, eye shaped shadow in his visual field, as if looking through stained-glass, involving both eye, gradually went away in 30 minutes, followed by mild left frontal temporal headache,  CT head without contrast in 2018 was normal  He was placed on Topamax as migraine prevention 50 mg every night, which has helped his headache,  He reported 1 episode on November 28, 2022, while driving home in the evening, he suddenly felt his head and visual field to noted, lasting for few minutes, prior to that incident, he already had mild to moderate persistent headache for couple weeks, seems to have worsening headache afterwards, he had tried over-the-counter Tylenol, ibuprofen, was given Imitrex 50 mg by primary care which has helped his headache  PHYSICAL EXAM  Vitals:   12/10/22 1502 12/10/22 1504  BP: (!) 149/78 130/86  Pulse: 82 82  Weight: 265 lb 8 oz (120.4 kg)   Height: 5\' 9"  (1.753 m)    Body mass index is 39.21 kg/m.   PHYSICAL EXAMNIATION:  Gen: NAD, conversant, well nourised, well groomed                     Cardiovascular: Regular rate rhythm, no peripheral edema, warm, nontender. Eyes: Conjunctivae clear without exudates  or hemorrhage Neck: Supple, no carotid bruits. Pulmonary: Clear to auscultation bilaterally   NEUROLOGICAL EXAM:  MENTAL STATUS: Speech/cognition: Awake, alert oriented to history taking and casual conversation, obese  CRANIAL NERVES: CN II: Visual fields are full to confrontation.  Pupils are round equal and briskly reactive to light. CN III, IV, VI: extraocular movement are normal. No ptosis. CN V: Facial sensation is intact to pinprick in all 3 divisions bilaterally. Corneal responses are intact.  CN VII: Face is symmetric with normal eye closure and smile. CN VIII: Hearing is normal to casual conversation CN IX, X: Palate elevates symmetrically. Phonation is normal. CN XI: Head turning and shoulder shrug are intact CN XII: Tongue is midline with normal movements and no atrophy.  MOTOR: There is no pronator drift of out-stretched arms. Muscle bulk and tone are normal. Muscle strength is normal.  REFLEXES: Reflexes are 1  and symmetric at the biceps, triceps, knees, and ankles. Plantar responses are flexor.  SENSORY: Intact to light touch, pinprick, positional and vibratory sensation are intact in fingers and toes.  COORDINATION: Rapid alternating movements and fine finger movements are intact. There is no dysmetria on finger-to-nose and heel-knee-shin.    GAIT/STANCE: Posture is normal. Gait is steady      REVIEW OF SYSTEMS: Out of a complete 14 system review of symptoms, the patient complains only of the following symptoms, and all other reviewed systems are negative.  See HPI  ALLERGIES: Allergies  Allergen Reactions   Dilantin [  Phenytoin] Other (See Comments)    Hallucinations   Gabapentin Other (See Comments)    Causes hallucinations    HOME MEDICATIONS: Outpatient Medications Prior to Visit  Medication Sig Dispense Refill   aspirin 81 MG EC tablet Take by mouth.     atorvastatin (LIPITOR) 20 MG tablet   2   cetirizine (ZYRTEC) 10 MG tablet Take 10 mg by  mouth daily.     cholecalciferol (VITAMIN D) 1000 units tablet Take 2,000 Units by mouth daily.     Continuous Blood Gluc Sensor (DEXCOM G6 SENSOR) MISC SMARTSIG:1 Topical Every 10 Days     Continuous Blood Gluc Transmit (DEXCOM G6 TRANSMITTER) MISC USE TRANSMITTER FOR 90 DAYS TO MONITOR BLOOD SUGAR. DO NOT DISCARD UNTIL AFTER 3 MONTHS.     glucagon (GLUCAGON EMERGENCY) 1 MG injection Inject 1 mg into the muscle once as needed. 1 each prn   lisinopril (PRINIVIL,ZESTRIL) 5 MG tablet Take by mouth.     NOVOLOG 100 UNIT/ML injection      pantoprazole (PROTONIX) 40 MG tablet Take 40 mg by mouth daily.     pregabalin (LYRICA) 75 MG capsule 1 capsule by mouth once a day at bedtime for 90 days     topiramate (TOPAMAX) 50 MG tablet Take 1 tablet (50 mg total) by mouth at bedtime. 90 tablet 3   No facility-administered medications prior to visit.    PAST MEDICAL HISTORY: Past Medical History:  Diagnosis Date   Classic migraine 08/05/2016   Depression, major, single episode    Diabetes mellitus without complication (HCC)    H/O cardiac catheterization    findings were normal   Hypertension    Injury of hip, left September 2015   Sleep apnea     PAST SURGICAL HISTORY: Past Surgical History:  Procedure Laterality Date   CORONARY ARTERY BYPASS GRAFT  Around 2011 or 2012   VASECTOMY     WISDOM TOOTH EXTRACTION     x8    FAMILY HISTORY: Family History  Problem Relation Age of Onset   Arthritis Mother    Leukemia Mother    Diabetes Father    Heart disease Father    Cancer Father    Migraines Father     SOCIAL HISTORY: Social History   Socioeconomic History   Marital status: Married    Spouse name: Aurther Loft   Number of children: 3   Years of education: Some college   Highest education level: Not on file  Occupational History   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 [employer]   Occupation: Full time student  Tobacco Use   Smoking status: Never   Smokeless tobacco: Never  Vaping Use    Vaping status: Never Used  Substance and Sexual Activity   Alcohol use: No   Drug use: No   Sexual activity: Not on file  Other Topics Concern   Not on file  Social History Narrative   Lives with wife, son and mother   Caffeine use: Drinks decaf at home   Caffeine drinks out to eat- 4-5 times per week   Right handed   Social Determinants of Health   Financial Resource Strain: Not on file  Food Insecurity: Not on file  Transportation Needs: Not on file  Physical Activity: Not on file  Stress: Not on file  Social Connections: Unknown (10/09/2021)   Received from [facility]   Social Network    Social Network: Not on file  Intimate Partner Violence: Unknown (10/09/2021)  Received from [facility], [facility]   HITS    Physically Hurt: Not on file    Insult or Talk Down To: Not on file    Threaten Physical Harm: Not on file    Scream or Curse: Not on file    Levert Feinstein, M.D. Ph.D.  [facility] [address] [place], [place] [address] Phone: [phone] Fax:      [phone]

## 2023-02-22 DIAGNOSIS — E7800 Pure hypercholesterolemia, unspecified: Secondary | ICD-10-CM | POA: Diagnosis not present

## 2023-02-22 DIAGNOSIS — G4733 Obstructive sleep apnea (adult) (pediatric): Secondary | ICD-10-CM | POA: Diagnosis not present

## 2023-02-22 DIAGNOSIS — E1065 Type 1 diabetes mellitus with hyperglycemia: Secondary | ICD-10-CM | POA: Diagnosis not present

## 2023-02-22 DIAGNOSIS — K219 Gastro-esophageal reflux disease without esophagitis: Secondary | ICD-10-CM | POA: Diagnosis not present

## 2023-02-22 DIAGNOSIS — Z0000 Encounter for general adult medical examination without abnormal findings: Secondary | ICD-10-CM | POA: Diagnosis not present

## 2023-02-22 DIAGNOSIS — Z23 Encounter for immunization: Secondary | ICD-10-CM | POA: Diagnosis not present

## 2023-03-05 ENCOUNTER — Encounter: Payer: Self-pay | Admitting: Neurology

## 2023-03-05 DIAGNOSIS — G43109 Migraine with aura, not intractable, without status migrainosus: Secondary | ICD-10-CM

## 2023-03-08 MED ORDER — SUMATRIPTAN SUCCINATE 50 MG PO TABS: ORAL_TABLET | ORAL | 6 refills | Status: AC

## 2023-03-11 ENCOUNTER — Ambulatory Visit: Payer: BC Managed Care – PPO | Admitting: Neurology

## 2023-04-01 DIAGNOSIS — J069 Acute upper respiratory infection, unspecified: Secondary | ICD-10-CM | POA: Diagnosis not present

## 2023-04-01 DIAGNOSIS — R5383 Other fatigue: Secondary | ICD-10-CM | POA: Diagnosis not present

## 2023-05-18 DIAGNOSIS — E1065 Type 1 diabetes mellitus with hyperglycemia: Secondary | ICD-10-CM | POA: Diagnosis not present

## 2023-05-18 DIAGNOSIS — E039 Hypothyroidism, unspecified: Secondary | ICD-10-CM | POA: Diagnosis not present

## 2023-05-18 DIAGNOSIS — E1042 Type 1 diabetes mellitus with diabetic polyneuropathy: Secondary | ICD-10-CM | POA: Diagnosis not present

## 2023-05-18 DIAGNOSIS — E7800 Pure hypercholesterolemia, unspecified: Secondary | ICD-10-CM | POA: Diagnosis not present

## 2023-09-21 DIAGNOSIS — E1065 Type 1 diabetes mellitus with hyperglycemia: Secondary | ICD-10-CM | POA: Diagnosis not present

## 2023-09-21 DIAGNOSIS — E7800 Pure hypercholesterolemia, unspecified: Secondary | ICD-10-CM | POA: Diagnosis not present

## 2023-09-21 DIAGNOSIS — E279 Disorder of adrenal gland, unspecified: Secondary | ICD-10-CM | POA: Diagnosis not present

## 2023-09-21 DIAGNOSIS — E538 Deficiency of other specified B group vitamins: Secondary | ICD-10-CM | POA: Diagnosis not present

## 2023-09-21 DIAGNOSIS — E039 Hypothyroidism, unspecified: Secondary | ICD-10-CM | POA: Diagnosis not present

## 2023-09-21 DIAGNOSIS — E1042 Type 1 diabetes mellitus with diabetic polyneuropathy: Secondary | ICD-10-CM | POA: Diagnosis not present

## 2024-01-25 DIAGNOSIS — E1042 Type 1 diabetes mellitus with diabetic polyneuropathy: Secondary | ICD-10-CM | POA: Diagnosis not present

## 2024-01-25 DIAGNOSIS — E538 Deficiency of other specified B group vitamins: Secondary | ICD-10-CM | POA: Diagnosis not present

## 2024-01-25 DIAGNOSIS — E7800 Pure hypercholesterolemia, unspecified: Secondary | ICD-10-CM | POA: Diagnosis not present

## 2024-01-25 DIAGNOSIS — E1065 Type 1 diabetes mellitus with hyperglycemia: Secondary | ICD-10-CM | POA: Diagnosis not present

## 2024-01-25 DIAGNOSIS — E039 Hypothyroidism, unspecified: Secondary | ICD-10-CM | POA: Diagnosis not present

## 2024-02-10 ENCOUNTER — Other Ambulatory Visit: Payer: Self-pay | Admitting: Neurology

## 2024-02-14 ENCOUNTER — Other Ambulatory Visit: Payer: Self-pay

## 2024-02-14 MED ORDER — TOPIRAMATE 100 MG PO TABS: 100.0000 mg | ORAL_TABLET | Freq: Every day | ORAL | 3 refills | Status: DC

## 2024-02-14 MED ORDER — TOPIRAMATE 100 MG PO TABS: 100.0000 mg | ORAL_TABLET | Freq: Every day | ORAL | 0 refills | Status: AC

## 2024-02-14 NOTE — Addendum Note (Signed)
 Addended by: MARDY LEOTIS RAMAN on: 02/14/2024 01:43 PM   Modules accepted: Orders

## 2024-02-14 NOTE — Telephone Encounter (Signed)
 Last OV 12/10/22 Next OV not scheduled  Last refill 12/10/22 Qty #90/3  Pt has not been seen in > 1 year, needs OV.

## 2024-02-14 NOTE — Telephone Encounter (Signed)
 Pt has been scheduled. Please send refill

## 2024-03-07 ENCOUNTER — Encounter: Payer: Self-pay | Admitting: Neurology

## 2024-06-08 ENCOUNTER — Ambulatory Visit: Admitting: Neurology
